# Patient Record
Sex: Female | Born: 1945 | Race: White | Hispanic: No | Marital: Married | Smoking: Former smoker
Health system: Southern US, Community
[De-identification: ages and names within clinical notes are randomized; demographics above are authoritative.]

## PROBLEM LIST (undated history)

## (undated) DIAGNOSIS — R519 Headache, unspecified: Secondary | ICD-10-CM

## (undated) DIAGNOSIS — C801 Malignant (primary) neoplasm, unspecified: Secondary | ICD-10-CM

## (undated) DIAGNOSIS — R51 Headache: Secondary | ICD-10-CM

## (undated) DIAGNOSIS — I10 Essential (primary) hypertension: Secondary | ICD-10-CM

## (undated) DIAGNOSIS — C50911 Malignant neoplasm of unspecified site of right female breast: Principal | ICD-10-CM

## (undated) DIAGNOSIS — E785 Hyperlipidemia, unspecified: Secondary | ICD-10-CM

## (undated) DIAGNOSIS — R05 Cough: Secondary | ICD-10-CM

## (undated) DIAGNOSIS — R059 Cough, unspecified: Secondary | ICD-10-CM

## (undated) DIAGNOSIS — R0981 Nasal congestion: Secondary | ICD-10-CM

## (undated) DIAGNOSIS — R062 Wheezing: Secondary | ICD-10-CM

## (undated) HISTORY — DX: Nasal congestion: R09.81

## (undated) HISTORY — DX: Headache, unspecified: R51.9

## (undated) HISTORY — DX: Malignant (primary) neoplasm, unspecified: C80.1

## (undated) HISTORY — DX: Wheezing: R06.2

## (undated) HISTORY — DX: Malignant neoplasm of unspecified site of right female breast: C50.911

## (undated) HISTORY — PX: OTHER SURGICAL HISTORY: SHX169

## (undated) HISTORY — DX: Headache: R51

## (undated) HISTORY — DX: Hyperlipidemia, unspecified: E78.5

## (undated) HISTORY — DX: Essential (primary) hypertension: I10

## (undated) HISTORY — DX: Cough, unspecified: R05.9

## (undated) HISTORY — DX: Cough: R05

---

## 1966-05-01 HISTORY — PX: BREAST LUMPECTOMY: SHX2

## 1976-05-01 HISTORY — PX: ABDOMINAL HYSTERECTOMY: SHX81

## 2007-05-02 HISTORY — PX: BREAST LUMPECTOMY: SHX2

## 2007-05-20 ENCOUNTER — Encounter: Admission: RE | Admit: 2007-05-20 | Discharge: 2007-05-20 | Payer: Self-pay | Admitting: Family Medicine

## 2007-05-20 ENCOUNTER — Encounter (INDEPENDENT_AMBULATORY_CARE_PROVIDER_SITE_OTHER): Payer: Self-pay | Admitting: Diagnostic Radiology

## 2007-05-29 ENCOUNTER — Encounter: Admission: RE | Admit: 2007-05-29 | Discharge: 2007-05-29 | Payer: Self-pay | Admitting: Family Medicine

## 2007-05-30 ENCOUNTER — Encounter: Admission: RE | Admit: 2007-05-30 | Discharge: 2007-05-30 | Payer: Self-pay | Admitting: Surgery

## 2007-06-10 ENCOUNTER — Encounter: Admission: RE | Admit: 2007-06-10 | Discharge: 2007-06-10 | Payer: Self-pay | Admitting: Surgery

## 2007-06-10 ENCOUNTER — Encounter (INDEPENDENT_AMBULATORY_CARE_PROVIDER_SITE_OTHER): Payer: Self-pay | Admitting: Diagnostic Radiology

## 2007-07-04 ENCOUNTER — Encounter: Admission: RE | Admit: 2007-07-04 | Discharge: 2007-07-04 | Payer: Self-pay | Admitting: Surgery

## 2007-07-05 ENCOUNTER — Encounter: Admission: RE | Admit: 2007-07-05 | Discharge: 2007-07-05 | Payer: Self-pay | Admitting: Surgery

## 2007-07-05 ENCOUNTER — Encounter (INDEPENDENT_AMBULATORY_CARE_PROVIDER_SITE_OTHER): Payer: Self-pay | Admitting: Surgery

## 2007-07-05 ENCOUNTER — Ambulatory Visit (HOSPITAL_BASED_OUTPATIENT_CLINIC_OR_DEPARTMENT_OTHER): Admission: RE | Admit: 2007-07-05 | Discharge: 2007-07-05 | Payer: Self-pay | Admitting: Surgery

## 2007-07-12 ENCOUNTER — Ambulatory Visit: Payer: Self-pay | Admitting: Oncology

## 2007-08-07 LAB — CBC WITH DIFFERENTIAL/PLATELET
Basophils Absolute: 0 10*3/uL (ref 0.0–0.1)
EOS%: 3.9 % (ref 0.0–7.0)
Eosinophils Absolute: 0.2 10*3/uL (ref 0.0–0.5)
HGB: 12.6 g/dL (ref 11.6–15.9)
MCH: 30.7 pg (ref 26.0–34.0)
MCV: 89.2 fL (ref 81.0–101.0)
MONO%: 11.7 % (ref 0.0–13.0)
NEUT#: 3.4 10*3/uL (ref 1.5–6.5)
RBC: 4.09 10*6/uL (ref 3.70–5.32)
RDW: 13.3 % (ref 11.3–14.5)
lymph#: 1.9 10*3/uL (ref 0.9–3.3)

## 2007-08-07 LAB — COMPREHENSIVE METABOLIC PANEL
AST: 20 U/L (ref 0–37)
Albumin: 4.6 g/dL (ref 3.5–5.2)
Alkaline Phosphatase: 91 U/L (ref 39–117)
Calcium: 9.5 mg/dL (ref 8.4–10.5)
Chloride: 107 mEq/L (ref 96–112)
Potassium: 4.2 mEq/L (ref 3.5–5.3)
Sodium: 144 mEq/L (ref 135–145)
Total Protein: 7.5 g/dL (ref 6.0–8.3)

## 2007-08-13 ENCOUNTER — Ambulatory Visit (HOSPITAL_COMMUNITY): Admission: RE | Admit: 2007-08-13 | Discharge: 2007-08-13 | Payer: Self-pay | Admitting: Oncology

## 2007-08-13 ENCOUNTER — Encounter: Payer: Self-pay | Admitting: Oncology

## 2007-08-14 ENCOUNTER — Ambulatory Visit: Admission: RE | Admit: 2007-08-14 | Discharge: 2007-11-11 | Payer: Self-pay | Admitting: Radiation Oncology

## 2007-08-21 LAB — CBC WITH DIFFERENTIAL/PLATELET
BASO%: 1.2 % (ref 0.0–2.0)
LYMPH%: 34.7 % (ref 14.0–48.0)
MCHC: 33.8 g/dL (ref 32.0–36.0)
MONO#: 0.4 10*3/uL (ref 0.1–0.9)
RBC: 3.89 10*6/uL (ref 3.70–5.32)
RDW: 11.9 % (ref 11.3–14.5)
WBC: 5.3 10*3/uL (ref 3.9–10.0)
lymph#: 1.9 10*3/uL (ref 0.9–3.3)

## 2007-08-28 ENCOUNTER — Ambulatory Visit: Payer: Self-pay | Admitting: Oncology

## 2007-08-28 LAB — CBC WITH DIFFERENTIAL/PLATELET
BASO%: 1.6 % (ref 0.0–2.0)
HCT: 34.2 % — ABNORMAL LOW (ref 34.8–46.6)
MCHC: 33.9 g/dL (ref 32.0–36.0)
MONO#: 0.4 10*3/uL (ref 0.1–0.9)
NEUT%: 37.6 % — ABNORMAL LOW (ref 39.6–76.8)
WBC: 4.4 10*3/uL (ref 3.9–10.0)
lymph#: 2 10*3/uL (ref 0.9–3.3)

## 2007-09-04 LAB — CBC WITH DIFFERENTIAL/PLATELET
BASO%: 1.5 % (ref 0.0–2.0)
LYMPH%: 40.4 % (ref 14.0–48.0)
MCH: 31.2 pg (ref 26.0–34.0)
MCHC: 34.5 g/dL (ref 32.0–36.0)
MCV: 90.4 fL (ref 81.0–101.0)
MONO%: 10.2 % (ref 0.0–13.0)
Platelets: 283 10*3/uL (ref 145–400)
RBC: 3.56 10*6/uL — ABNORMAL LOW (ref 3.70–5.32)

## 2007-10-02 LAB — CBC WITH DIFFERENTIAL/PLATELET
Basophils Absolute: 0.1 10*3/uL (ref 0.0–0.1)
Eosinophils Absolute: 0.2 10*3/uL (ref 0.0–0.5)
HCT: 29.8 % — ABNORMAL LOW (ref 34.8–46.6)
HGB: 10.3 g/dL — ABNORMAL LOW (ref 11.6–15.9)
MCV: 92.2 fL (ref 81.0–101.0)
MONO%: 10.7 % (ref 0.0–13.0)
NEUT#: 1.6 10*3/uL (ref 1.5–6.5)
NEUT%: 40.5 % (ref 39.6–76.8)
RDW: 14.3 % (ref 11.3–14.5)
lymph#: 1.6 10*3/uL (ref 0.9–3.3)

## 2007-10-07 ENCOUNTER — Ambulatory Visit: Payer: Self-pay | Admitting: Oncology

## 2007-10-09 LAB — CBC WITH DIFFERENTIAL/PLATELET
Basophils Absolute: 0.1 10*3/uL (ref 0.0–0.1)
Eosinophils Absolute: 0.1 10*3/uL (ref 0.0–0.5)
HGB: 10.2 g/dL — ABNORMAL LOW (ref 11.6–15.9)
LYMPH%: 43.8 % (ref 14.0–48.0)
MCV: 93.3 fL (ref 81.0–101.0)
MONO%: 10.3 % (ref 0.0–13.0)
NEUT#: 1.6 10*3/uL (ref 1.5–6.5)
Platelets: 263 10*3/uL (ref 145–400)

## 2007-11-12 ENCOUNTER — Encounter: Payer: Self-pay | Admitting: Oncology

## 2007-11-12 ENCOUNTER — Ambulatory Visit: Admission: RE | Admit: 2007-11-12 | Discharge: 2007-11-12 | Payer: Self-pay | Admitting: Oncology

## 2007-11-24 ENCOUNTER — Ambulatory Visit: Payer: Self-pay | Admitting: Oncology

## 2007-12-09 LAB — CBC WITH DIFFERENTIAL/PLATELET
Eosinophils Absolute: 0.2 10*3/uL (ref 0.0–0.5)
LYMPH%: 33.1 % (ref 14.0–48.0)
MCV: 93.7 fL (ref 81.0–101.0)
MONO%: 10.9 % (ref 0.0–13.0)
NEUT#: 2.1 10*3/uL (ref 1.5–6.5)
RBC: 3.73 10*6/uL (ref 3.70–5.32)
RDW: 12.6 % (ref 11.3–14.5)

## 2007-12-09 LAB — COMPREHENSIVE METABOLIC PANEL
AST: 19 U/L (ref 0–37)
BUN: 17 mg/dL (ref 6–23)
Calcium: 9.6 mg/dL (ref 8.4–10.5)
Chloride: 106 mEq/L (ref 96–112)
Creatinine, Ser: 1.11 mg/dL (ref 0.40–1.20)
Total Bilirubin: 0.6 mg/dL (ref 0.3–1.2)

## 2007-12-09 LAB — CANCER ANTIGEN 27.29: CA 27.29: 29 U/mL (ref 0–39)

## 2008-02-07 ENCOUNTER — Ambulatory Visit: Payer: Self-pay | Admitting: Oncology

## 2008-02-11 LAB — CBC WITH DIFFERENTIAL/PLATELET
BASO%: 1.1 % (ref 0.0–2.0)
HCT: 36.2 % (ref 34.8–46.6)
HGB: 12.4 g/dL (ref 11.6–15.9)
LYMPH%: 33.1 % (ref 14.0–48.0)
MCHC: 34.1 g/dL (ref 32.0–36.0)
MCV: 90.1 fL (ref 81.0–101.0)
MONO#: 0.5 10*3/uL (ref 0.1–0.9)
NEUT#: 2.4 10*3/uL (ref 1.5–6.5)
NEUT%: 51.1 % (ref 39.6–76.8)
RBC: 4.02 10*6/uL (ref 3.70–5.32)
RDW: 12 % (ref 11.3–14.5)
WBC: 4.7 10*3/uL (ref 3.9–10.0)
lymph#: 1.6 10*3/uL (ref 0.9–3.3)

## 2008-06-26 ENCOUNTER — Ambulatory Visit: Payer: Self-pay | Admitting: Oncology

## 2008-06-30 LAB — COMPREHENSIVE METABOLIC PANEL
AST: 19 U/L (ref 0–37)
Alkaline Phosphatase: 72 U/L (ref 39–117)
CO2: 20 mEq/L (ref 19–32)
Sodium: 142 mEq/L (ref 135–145)
Total Bilirubin: 0.4 mg/dL (ref 0.3–1.2)
Total Protein: 7.1 g/dL (ref 6.0–8.3)

## 2008-06-30 LAB — CBC WITH DIFFERENTIAL/PLATELET
BASO%: 0.4 % (ref 0.0–2.0)
Eosinophils Absolute: 0.2 10*3/uL (ref 0.0–0.5)
HCT: 35.5 % (ref 34.8–46.6)
LYMPH%: 26.2 % (ref 14.0–49.7)
MCH: 31 pg (ref 25.1–34.0)
MCHC: 33.9 g/dL (ref 31.5–36.0)
MONO%: 9.2 % (ref 0.0–14.0)
Platelets: 203 10*3/uL (ref 145–400)
RDW: 12.9 % (ref 11.2–14.5)

## 2008-10-15 ENCOUNTER — Ambulatory Visit: Payer: Self-pay | Admitting: Oncology

## 2008-10-19 ENCOUNTER — Encounter: Payer: Self-pay | Admitting: Pulmonary Disease

## 2008-10-20 DIAGNOSIS — E785 Hyperlipidemia, unspecified: Secondary | ICD-10-CM

## 2008-10-21 ENCOUNTER — Ambulatory Visit: Payer: Self-pay | Admitting: Pulmonary Disease

## 2008-10-21 DIAGNOSIS — R0602 Shortness of breath: Secondary | ICD-10-CM | POA: Insufficient documentation

## 2008-11-13 ENCOUNTER — Ambulatory Visit: Payer: Self-pay | Admitting: Pulmonary Disease

## 2008-12-22 ENCOUNTER — Ambulatory Visit: Payer: Self-pay | Admitting: Pulmonary Disease

## 2008-12-22 LAB — CONVERTED CEMR LAB
CO2: 29 meq/L (ref 19–32)
Calcium: 9.2 mg/dL (ref 8.4–10.5)
GFR calc non Af Amer: 53.45 mL/min (ref >60)
Glucose, Bld: 93 mg/dL (ref 70–99)

## 2008-12-23 ENCOUNTER — Ambulatory Visit: Payer: Self-pay | Admitting: Internal Medicine

## 2009-07-02 ENCOUNTER — Ambulatory Visit: Payer: Self-pay | Admitting: Oncology

## 2009-07-06 LAB — CBC WITH DIFFERENTIAL/PLATELET
Basophils Absolute: 0 10*3/uL (ref 0.0–0.1)
Eosinophils Absolute: 0.1 10*3/uL (ref 0.0–0.5)
HGB: 13.5 g/dL (ref 11.6–15.9)
LYMPH%: 36.7 % (ref 14.0–49.7)
MCHC: 33.7 g/dL (ref 31.5–36.0)
MONO%: 10.5 % (ref 0.0–14.0)
RBC: 4.28 10*6/uL (ref 3.70–5.45)

## 2009-07-06 LAB — COMPREHENSIVE METABOLIC PANEL
AST: 22 U/L (ref 0–37)
Albumin: 4.4 g/dL (ref 3.5–5.2)
Chloride: 106 mEq/L (ref 96–112)
Total Bilirubin: 0.8 mg/dL (ref 0.3–1.2)
Total Protein: 7.6 g/dL (ref 6.0–8.3)

## 2009-07-06 LAB — CANCER ANTIGEN 27.29: CA 27.29: 31 U/mL (ref 0–39)

## 2009-07-07 ENCOUNTER — Ambulatory Visit (HOSPITAL_COMMUNITY): Admission: RE | Admit: 2009-07-07 | Discharge: 2009-07-07 | Payer: Self-pay | Admitting: Oncology

## 2010-07-07 ENCOUNTER — Other Ambulatory Visit: Payer: Self-pay | Admitting: Oncology

## 2010-07-07 ENCOUNTER — Encounter (HOSPITAL_BASED_OUTPATIENT_CLINIC_OR_DEPARTMENT_OTHER): Payer: BC Managed Care – PPO | Admitting: Oncology

## 2010-07-07 DIAGNOSIS — C50919 Malignant neoplasm of unspecified site of unspecified female breast: Secondary | ICD-10-CM

## 2010-07-07 DIAGNOSIS — Z170 Estrogen receptor positive status [ER+]: Secondary | ICD-10-CM

## 2010-07-07 LAB — COMPREHENSIVE METABOLIC PANEL
AST: 22 U/L (ref 0–37)
Albumin: 4.4 g/dL (ref 3.5–5.2)
Alkaline Phosphatase: 72 U/L (ref 39–117)
CO2: 25 mEq/L (ref 19–32)
Calcium: 9.3 mg/dL (ref 8.4–10.5)
Creatinine, Ser: 1.59 mg/dL — ABNORMAL HIGH (ref 0.40–1.20)
Total Bilirubin: 0.8 mg/dL (ref 0.3–1.2)
Total Protein: 6.8 g/dL (ref 6.0–8.3)

## 2010-07-07 LAB — CBC WITH DIFFERENTIAL/PLATELET
Eosinophils Absolute: 0.2 10*3/uL (ref 0.0–0.5)
HCT: 34.1 % — ABNORMAL LOW (ref 34.8–46.6)
MCHC: 34.3 g/dL (ref 31.5–36.0)
MONO#: 0.6 10*3/uL (ref 0.1–0.9)
NEUT#: 2.6 10*3/uL (ref 1.5–6.5)
RBC: 3.75 10*6/uL (ref 3.70–5.45)
RDW: 13.3 % (ref 11.2–14.5)
lymph#: 2 10*3/uL (ref 0.9–3.3)

## 2010-09-01 ENCOUNTER — Encounter (INDEPENDENT_AMBULATORY_CARE_PROVIDER_SITE_OTHER): Payer: Self-pay | Admitting: Surgery

## 2010-09-13 NOTE — Op Note (Signed)
NAME:  Carolyn Strong, Carolyn Strong                ACCOUNT NO.:  [id number]   MEDICAL RECORD NO.:  [id number]          PATIENT TYPE:  AMB   LOCATION:  DSC                          FACILITY:  [facility]   PHYSICIAN:  Thomas A. Cornett, M.D.DATE OF BIRTH:  02-19-1946   DATE OF PROCEDURE:  07/05/2007  DATE OF DISCHARGE:                               OPERATIVE REPORT   PREOPERATIVE DIAGNOSIS:  Right breast cancer x2.   POSTOPERATIVE DIAGNOSIS:  Right breast cancer x2.   PROCEDURES:  1. Right breast needle-localized lumpectomy using two localizing      wires.  2. Right axillary sentinel lymph node mapping with methylene blue dye.   SURGEON:  Maisie Fus A. Cornett, M.D.   ANESTHESIA:  LMA with 0.25% Sensorcaine local.   ESTIMATED BLOOD LOSS:  Approximately 30 mL.   SPECIMENS:  1. Right breast tissue with localizing wire and skin x2 masses to      pathology.  2. Additional margins of lumpectomy cavity.  3. Two right axillary sentinel lymph nodes negative by touch prep.   DRAINS:  None.   INDICATIONS FOR PROCEDURE:  The patient is a 65 year old female who  presented with right breast cancer.  A second foci was identified on  further workup and she had to foci within 2 cm of each other.  She  wished to undergo breast conserving measures if possible and I felt we  could make an attempt to that, with the understanding that if we fail,  mastectomy would be warranted.  She voiced understanding and wished to  proceed.   DESCRIPTION OF PROCEDURE:  After undergoing a wire localization of the  right breast in radiology and injection of technetium sulfur colloid by  the radiology tech, she was brought back to the operating room.  After  induction of LMA anesthesia, 4 mL of methylene blue dye were injected in  a subareolar position under sterile conditions.  Her right breast and  right axilla were prepped and draped in sterile fashion.  The sentinel  node was done first.  The NeoProbe was used, a hot spot  identified in  the right axilla.  Small incision was made.  I was able to identify two  hot blue sentinel nodes which were negative by touch prep.  Background  counts approached zero of the axilla of the supraclavicular region and  internal mammary region.  I placed a moist gauze here.  Next, a  lumpectomy was done.  I infiltrated the skin with 0.25% Sensorcaine.  The wires came out in the right medial inner quadrant.  I took an  ellipse of skin with the incision in a transverse fashion in the medial  breast.  We excised all the tissue around the wires down well below the  nipple.  Radiographs revealed the specimen to be adequate.  I took  additional margins though since some of the margins felt close.  We then  irrigated this out.  We had a very nice lumpectomy with adequate tissue.  We then closed this wound was some 3-0 Vicryl.  The 4-0 Monocryl was  used to close the  skin.  Dermabond was applied to  the skin.  Next, I  irrigated out the axilla, found it to be hemostatic and closed it in  layers  with 3-0 Vicryl, 4-0 Monocryl as a subcuticular stitch and then  Dermabond.  All final counts of sponge, needle and instruments were  found be correct at this portion of the case.  The patient was awoke  taken to recovery in satisfactory condition.      Thomas A. Cornett, M.D.  Electronically Signed     TAC/MEDQ  D:  07/05/2007  T:  07/07/2007  Job:  [id number]   cc:   Dwaine Gale.

## 2011-01-23 LAB — CBC
HCT: 35.7 — ABNORMAL LOW
Hemoglobin: 12.1
MCV: 92.1
RBC: 3.87
WBC: 5.3

## 2011-01-23 LAB — BASIC METABOLIC PANEL
BUN: 16
Chloride: 107
GFR calc Af Amer: >60
Potassium: 4.1

## 2011-01-23 LAB — DIFFERENTIAL
Eosinophils Absolute: 0.2
Eosinophils Relative: 4
Lymphs Abs: 1.9
Monocytes Absolute: 0.5
Monocytes Relative: 9

## 2011-03-17 ENCOUNTER — Encounter (INDEPENDENT_AMBULATORY_CARE_PROVIDER_SITE_OTHER): Payer: Self-pay | Admitting: Surgery

## 2011-03-17 ENCOUNTER — Ambulatory Visit (INDEPENDENT_AMBULATORY_CARE_PROVIDER_SITE_OTHER): Payer: BC Managed Care – PPO | Admitting: Surgery

## 2011-03-17 VITALS — BP 132/84 | HR 68 | Temp 97.6°F | Resp 16 | Ht 65.0 in | Wt 162.4 lb

## 2011-03-17 DIAGNOSIS — Z853 Personal history of malignant neoplasm of breast: Secondary | ICD-10-CM

## 2011-03-17 NOTE — Patient Instructions (Signed)
Follow up in year

## 2011-03-17 NOTE — Progress Notes (Signed)
Subjective:     Patient ID: Carolyn Strong, female   DOB: Nov 20, 1945, 65 y.o.   MRN: [id number]  HPI The patient presents today for followup due to history of breast cancer. She underwent a lumpectomy with sentinel lymph mapping in 2009 for a T1 N0 MX right breast cancer. This was ER and PR positive. She has some right breast soreness. She has no breast mass noted.   Review of Systems  Constitutional: Negative for fever, chills and unexpected weight change.  HENT: Negative for hearing loss, congestion, sore throat, trouble swallowing and voice change.   Eyes: Negative for visual disturbance.  Respiratory: Negative for cough and wheezing.   Cardiovascular: Negative for chest pain, palpitations and leg swelling.  Gastrointestinal: Negative for nausea, vomiting, abdominal pain, diarrhea, constipation, blood in stool, abdominal distention and anal bleeding.  Genitourinary: Negative for hematuria, vaginal bleeding and difficulty urinating.  Musculoskeletal: Negative for arthralgias.  Skin: Negative for rash and wound.  Neurological: Negative for seizures, syncope and headaches.  Hematological: Negative for adenopathy. Does not bruise/bleed easily.  Psychiatric/Behavioral: Negative for confusion.       Objective:   Physical Exam  Constitutional: She appears well-developed and well-nourished.  HENT:  Head: Normocephalic and atraumatic.  Neck: Normal range of motion. Neck supple.  Pulmonary/Chest: Effort normal and breath sounds normal.       Mild right breast cosmetic deformity medial right breast. No mass right breast. Right axilla normal. Left breast normal. Left axilla normal.       Assessment:     History of stage I right breast cancer    Plan:     Return to clinic one year.

## 2011-08-08 ENCOUNTER — Other Ambulatory Visit: Payer: Self-pay | Admitting: *Deleted

## 2011-08-08 DIAGNOSIS — E785 Hyperlipidemia, unspecified: Secondary | ICD-10-CM

## 2011-08-08 DIAGNOSIS — Z853 Personal history of malignant neoplasm of breast: Secondary | ICD-10-CM

## 2011-08-09 ENCOUNTER — Telehealth: Payer: Self-pay | Admitting: *Deleted

## 2011-08-09 ENCOUNTER — Ambulatory Visit (HOSPITAL_BASED_OUTPATIENT_CLINIC_OR_DEPARTMENT_OTHER): Payer: BC Managed Care – PPO | Admitting: Physician Assistant

## 2011-08-09 ENCOUNTER — Encounter: Payer: Self-pay | Admitting: Physician Assistant

## 2011-08-09 ENCOUNTER — Other Ambulatory Visit (HOSPITAL_BASED_OUTPATIENT_CLINIC_OR_DEPARTMENT_OTHER): Payer: BC Managed Care – PPO | Admitting: Lab

## 2011-08-09 VITALS — BP 163/94 | HR 58 | Temp 97.7°F | Ht 65.0 in | Wt 165.0 lb

## 2011-08-09 DIAGNOSIS — C50919 Malignant neoplasm of unspecified site of unspecified female breast: Secondary | ICD-10-CM

## 2011-08-09 DIAGNOSIS — E785 Hyperlipidemia, unspecified: Secondary | ICD-10-CM

## 2011-08-09 DIAGNOSIS — Z853 Personal history of malignant neoplasm of breast: Secondary | ICD-10-CM

## 2011-08-09 DIAGNOSIS — Z170 Estrogen receptor positive status [ER+]: Secondary | ICD-10-CM

## 2011-08-09 DIAGNOSIS — C50911 Malignant neoplasm of unspecified site of right female breast: Secondary | ICD-10-CM

## 2011-08-09 HISTORY — DX: Malignant neoplasm of unspecified site of right female breast: C50.911

## 2011-08-09 LAB — CBC WITH DIFFERENTIAL/PLATELET
BASO%: 1.1 % (ref 0.0–2.0)
Eosinophils Absolute: 0.2 10*3/uL (ref 0.0–0.5)
HCT: 34.9 % (ref 34.8–46.6)
LYMPH%: 37.6 % (ref 14.0–49.7)
MCHC: 33.8 g/dL (ref 31.5–36.0)
MCV: 90.1 fL (ref 79.5–101.0)
MONO#: 0.6 10*3/uL (ref 0.1–0.9)
MONO%: 13.8 % (ref 0.0–14.0)
NEUT%: 42.9 % (ref 38.4–76.8)
Platelets: 242 10*3/uL (ref 145–400)
RBC: 3.87 10*6/uL (ref 3.70–5.45)
nRBC: 0 % (ref 0–0)

## 2011-08-09 LAB — COMPREHENSIVE METABOLIC PANEL
ALT: 19 U/L (ref 0–35)
BUN: 28 mg/dL — ABNORMAL HIGH (ref 6–23)
CO2: 25 mEq/L (ref 19–32)
Calcium: 9 mg/dL (ref 8.4–10.5)
Chloride: 107 mEq/L (ref 96–112)
Creatinine, Ser: 1.29 mg/dL — ABNORMAL HIGH (ref 0.50–1.10)
Total Bilirubin: 0.8 mg/dL (ref 0.3–1.2)

## 2011-08-09 NOTE — Telephone Encounter (Signed)
gave patient appointment for 07-2012 printed out calendar and gave to the patient 

## 2011-08-09 NOTE — Progress Notes (Signed)
ID: Carolyn Strong   DOB: 1946/03/18  MR#: [id number]  WUJ#:[id number]  HISTORY OF PRESENT ILLNESS: The patient had a screening mammogram at [facility] which showed a suspicious mass in the right breast and [facility] on May 20, 2007.  After review of the mammogram from January 2nd and 12th from [facility], the patient had an ultrasound-guided core biopsy of the right breast mass and this showed ([id number] and [id number]) an invasive ductal carcinoma, grade 2, measuring a maximum of 5 mm on the glass slide, ER positive at 50%, PR positive at 40% with an MIB-1 of 15%, Hercept test equivocal but FISH positive at a ratio of 2.72.    With this information, the patient was referred to Dr. Luisa Hart and on May 29, 2007, had bilateral breast MRIs.  This showed the area of original biopsy but in addition, 2 cm anterior to that area, there was a 7 mm enhancing nodule.  The rest of the right breast and the left breast were unremarkable.    The second area of interest was looked for by ultrasound on the same day, January 29th, but could not be located, and therefore, the patient had an MRI-guided core biopsy of the 2nd breast mass June 10, 2007.  This showed ([id number] and [id number]) an invasive ductal carcinoma, Grade 2, 100% ER positive, 100% PR positive, with an elevated MIB-1 at 27%, HER-2 negative at 1+.  With this information and after appropriate discussion with Dr. Luisa Hart, the patient underwent right lumpectomy and sentinel lymph node biopsy July 05, 2007.  The final pathology ([id number]) showed that the area of the first mass (at 4 o'clock) showed no residual tumor.  The second area measured 7 mm.  There was no evidence of lymphovascular invasion.  Margins were negative and ample.  Zero of 2 sentinel lymph nodes were involved.    The patient received adjuvant chemotherapy consisting of 9 doses of paclitaxel with trastuzumab, completed in June of  2009. This was followed by radiation therapy. She then took tamoxifen for 6 months which was discontinued due to vaginal discharge issues. She declined switching to an aromatase inhibitor, and has been followed off treatment since.  INTERVAL HISTORY: Carolyn Strong returns today for routine one-year followup of her right breast carcinoma. Interval history is unremarkable, and Carolyn Strong is staying busy, volunteering for [employer], gardening, and taking care of her 2 grandchildren (ages 15 and 35).   REVIEW OF SYSTEMS: Carolyn Strong has no new complaints today. 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 She has some tightness and aching in her shoulders which she attributes to stress. She denies any additional back pain. She has no headaches, dizziness, change in vision. She's had no recent illnesses and denies fevers, chills, or night sweats. No nausea or change in bowel habits. No chest pain or shortness of breath.  A detailed review of systems is otherwise noncontributory.  PAST MEDICAL HISTORY: Past Medical History  Diagnosis Date  . Cancer     right breast  . Generalized headaches   . Wheezing   . Cough   . Nasal congestion   . Breast cancer, right breast 08/09/2011    PAST SURGICAL HISTORY: Past Surgical History  Procedure Date  . Cyst removed     LEFT BREAST  . Breast lumpectomy 2009    RIGHT BREAST  . Breast lumpectomy 1968    left breast  . Abdominal hysterectomy 1978  FAMILY HISTORY Family History  Problem Relation Age of Onset  . Cancer Father   . Dementia Father   . Cancer Maternal Aunt     breast  . Cancer Paternal Aunt     breast    GYN HISTORY:  The patient is GX, P1.  Her first pregnancy to term was at 70, but her other pregnancy was a premature at 5 months and he survived and is doing fine.  History of simple hysterectomy in 1978 without salpingo-oophorectomy. The patient never took hormone replacement.  She has rare hot flashes, which are not a major  issue for her.  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. Trey Paula is an Radio broadcast assistant.  Their two children are Rocky Link, who works for the [employer] in [facility], and is married with 2 children; and Tammy Sours, who is a Nurse, adult in [place].  The patient has two grandchildren.  The patient is a Control and instrumentation engineer.      ADVANCED DIRECTIVES:  HEALTH MAINTENANCE: History  Substance Use Topics  . Smoking status: Former Smoker    Quit date: 09/17/2007  . Smokeless tobacco: Never Used  . Alcohol Use: No     Colonoscopy:  PAP:  Bone density:  Lipid panel:  Allergies  Allergen Reactions  . Aspirin     REACTION: hives  . Penicillins     REACTION: hives  . Sulfonamide Derivatives     REACTION: hives    Current Outpatient Prescriptions  Medication Sig Dispense Refill  . calcium carbonate 200 MG capsule Take 250 mg by mouth 2 (two) times daily with a meal.      . fluvastatin XL (LESCOL XL) 80 MG 24 hr tablet Take 80 mg by mouth daily.          OBJECTIVE: Filed Vitals:   08/09/11 0909  BP: 163/94  Pulse: 58  Temp: 97.7 F (36.5 C)     Body mass index is 27.46 kg/(m^2).    ECOG FS: 0  Physical Exam: HEENT:  Sclerae anicteric, conjunctivae pink.  Oropharynx clear.  No mucositis or candidiasis.   Nodes:  No cervical, supraclavicular, or axillary lymphadenopathy palpated.  Breast Exam:  Right breast is status post lumpectomy, or tenderness to palpation. No suspicious nodularities or skin changes and no evidence of local recurrence. Left breast is benign with no masses, skin changes, or nipple inversion.    Lungs:  Clear to auscultation bilaterally.  No crackles, rhonchi, or wheezes.   Heart:  Regular rate and rhythm.  , murmurs, or rubs.  Abdomen:  Soft, nontender.  Positive bowel sounds.  No organomegaly or masses palpated.   Musculoskeletal:  No focal spinal tenderness to palpation.  Extremities:  Benign.  No peripheral edema or  cyanosis.   Skin:  Benign.   Neuro:  Nonfocal. alert and oriented x3.    LAB RESULTS: Lab Results  Component Value Date   WBC 4.2 08/09/2011   NEUTROABS 1.8 08/09/2011   HGB 11.8 08/09/2011   HCT 34.9 08/09/2011   MCV 90.1 08/09/2011   PLT 242 08/09/2011      Chemistry      Component Value Date/Time   NA 141 07/07/2010 1323   K 3.8 07/07/2010 1323   CL 104 07/07/2010 1323   CO2 25 07/07/2010 1323   BUN 29* 07/07/2010 1323   CREATININE 1.59* 07/07/2010 1323      Component Value Date/Time   CALCIUM 9.3 07/07/2010 1323   ALKPHOS  72 07/07/2010 1323   AST 22 07/07/2010 1323   ALT 16 07/07/2010 1323   BILITOT 0.8 07/07/2010 1323       Lab Results  Component Value Date   LABCA2 31 07/06/2009    STUDIES: Most recent mammogram was at [facility] in January 2013. We do not have a copy of that report, but the patient will have that faxed to [place]. Per report it was "normal".   ASSESSMENT: A soon-to-be 66 year old [place] woman   (1) status post right lumpectomy and sentinel lymph node biopsy March 2009 for 2 separate masses, the first one measuring 5 mm, triple positive, with an MIB-1 of 15%, the second one measuring 7 mm, ER/PR positive but HER-2/neu negative, with MIB-1 of 27%.  She had 0 of 2 sentinel lymph nodes involved   (2)  Received 9 doses of Taxol with Herceptin, completed in June 2009, followed by radiation.    (3)  She then took tamoxifen for 6 months, discontinued because of vaginal discharge issues.  She decided to forego switching to an aromatase inhibitor and has been followed off treatment since.    PLAN: With regards to her breast cancer, Chelesa continues to do well, and there is no clinical evidence of disease recurrence. She will return for routine followup in one year, and at that time we will likely discharge her from followup. She voices understanding and agreement with our plan and will call with any changes or problems.   Venna Berberich    08/09/2011

## 2011-08-16 ENCOUNTER — Ambulatory Visit: Payer: BC Managed Care – PPO | Admitting: Oncology

## 2011-08-16 ENCOUNTER — Other Ambulatory Visit: Payer: BC Managed Care – PPO | Admitting: Lab

## 2012-03-19 ENCOUNTER — Ambulatory Visit (INDEPENDENT_AMBULATORY_CARE_PROVIDER_SITE_OTHER): Payer: Medicare Other | Admitting: Surgery

## 2012-03-19 ENCOUNTER — Encounter (INDEPENDENT_AMBULATORY_CARE_PROVIDER_SITE_OTHER): Payer: Self-pay | Admitting: Surgery

## 2012-03-19 VITALS — BP 208/92 | HR 72 | Temp 97.5°F | Resp 18 | Ht 65.0 in | Wt 166.0 lb

## 2012-03-19 DIAGNOSIS — Z853 Personal history of malignant neoplasm of breast: Secondary | ICD-10-CM

## 2012-03-19 NOTE — Patient Instructions (Signed)
Return 1 year. 

## 2012-03-19 NOTE — Progress Notes (Signed)
Subjective:     Patient ID: Carolyn Strong, female   DOB: [birth year], 66 y.o.   MRN: [id number]  HPI The patient presents today for followup due to history of breast cancer. She underwent a lumpectomy with sentinel lymph mapping in 2009 for a T1 N0 MX right breast cancer. This was ER and PR positive. She has some right breast soreness. She has no breast mass noted.   Review of Systems  Constitutional: Negative for fever, chills and unexpected weight change.  HENT: Negative for hearing loss, congestion, sore throat, trouble swallowing and voice change.   Eyes: Negative for visual disturbance.  Respiratory: Negative for cough and wheezing.   Cardiovascular: Negative for chest pain, palpitations and leg swelling.  Gastrointestinal: Negative for nausea, vomiting, abdominal pain, diarrhea, constipation, blood in stool, abdominal distention and anal bleeding.  Genitourinary: Negative for hematuria, vaginal bleeding and difficulty urinating.  Musculoskeletal: Negative for arthralgias.  Skin: Negative for rash and wound.  Neurological: Negative for seizures, syncope and headaches.  Hematological: Negative for adenopathy. Does not bruise/bleed easily.  Psychiatric/Behavioral: Negative for confusion.       Objective:   Physical Exam  Constitutional: She appears well-developed and well-nourished.  HENT:  Head: Normocephalic and atraumatic.  Neck: Normal range of motion. Neck supple.  Pulmonary/Chest: Effort normal and breath sounds normal.       Mild right breast cosmetic deformity medial right breast. No mass right breast. Right axilla normal. Left breast normal. Left axilla normal.  Mammogram in [facility] Jan 2013 no change stable     Assessment:     History of stage I right breast cancer    Plan:     Return to clinic one year.

## 2012-08-01 ENCOUNTER — Other Ambulatory Visit (HOSPITAL_BASED_OUTPATIENT_CLINIC_OR_DEPARTMENT_OTHER): Payer: Medicare Other | Admitting: Lab

## 2012-08-01 DIAGNOSIS — C50911 Malignant neoplasm of unspecified site of right female breast: Secondary | ICD-10-CM

## 2012-08-01 DIAGNOSIS — C50919 Malignant neoplasm of unspecified site of unspecified female breast: Secondary | ICD-10-CM

## 2012-08-01 LAB — CBC WITH DIFFERENTIAL/PLATELET
BASO%: 1.2 % (ref 0.0–2.0)
EOS%: 3.2 % (ref 0.0–7.0)
Eosinophils Absolute: 0.2 10*3/uL (ref 0.0–0.5)
LYMPH%: 36.9 % (ref 14.0–49.7)
MCH: 30.4 pg (ref 25.1–34.0)
MCHC: 33.8 g/dL (ref 31.5–36.0)
MCV: 89.9 fL (ref 79.5–101.0)
MONO%: 14.1 % — ABNORMAL HIGH (ref 0.0–14.0)
Platelets: 235 10*3/uL (ref 145–400)
RBC: 4 10*6/uL (ref 3.70–5.45)
RDW: 13.1 % (ref 11.2–14.5)

## 2012-08-01 LAB — COMPREHENSIVE METABOLIC PANEL (CC13)
AST: 23 U/L (ref 5–34)
Alkaline Phosphatase: 101 U/L (ref 40–150)
Glucose: 94 mg/dl (ref 70–99)
Sodium: 140 mEq/L (ref 136–145)
Total Bilirubin: 0.93 mg/dL (ref 0.20–1.20)
Total Protein: 7.1 g/dL (ref 6.4–8.3)

## 2012-08-08 ENCOUNTER — Ambulatory Visit (HOSPITAL_BASED_OUTPATIENT_CLINIC_OR_DEPARTMENT_OTHER): Payer: Medicare Other | Admitting: Oncology

## 2012-08-08 VITALS — BP 173/75 | HR 57 | Temp 97.9°F | Resp 20 | Ht 65.0 in | Wt 169.4 lb

## 2012-08-08 DIAGNOSIS — C50911 Malignant neoplasm of unspecified site of right female breast: Secondary | ICD-10-CM

## 2012-08-08 DIAGNOSIS — Z853 Personal history of malignant neoplasm of breast: Secondary | ICD-10-CM

## 2012-08-08 NOTE — Progress Notes (Signed)
ID: Carolyn Strong   DOB: 12/29/1945  MR#: [id number]  WGN#:[id number]  PCP: Mary Sella, NP   HISTORY OF PRESENT ILLNESS: The patient had a screening mammogram at [facility] which showed a suspicious mass in the right breast and was referred to the [facility] for further evaluation on May 20, 2007.  After review of the mammogram from January 2nd and 12th from [facility], the patient had an ultrasound-guided core biopsy of the right breast mass and this showed ([id number] and [id number]) an invasive ductal carcinoma, grade 2, measuring a maximum of 5 mm on the glass slide, ER positive at 50%, PR positive at 40% with an MIB-1 of 15%, Hercept test equivocal but FISH positive at a ratio of 2.72.    With this information, the patient was referred to Dr. Luisa Hart and on May 29, 2007, had bilateral breast MRIs.  This showed the area of original biopsy but in addition, 2 cm anterior to that area, there was a 7 mm enhancing nodule.  The rest of the right breast and the left breast were unremarkable.    The second area of interest was looked for by ultrasound on the same day, January 29th, but could not be located, and therefore, the patient had an MRI-guided core biopsy of the 2nd breast mass June 10, 2007.  This showed ([id number] and [id number]) an invasive ductal carcinoma, Grade 2, 100% ER positive, 100% PR positive, with an elevated MIB-1 at 27%, HER-2 negative at 1+.  With this information and after appropriate discussion with Dr. Luisa Hart, the patient underwent right lumpectomy and sentinel lymph node biopsy July 05, 2007.  The final pathology ([id number]) showed that the area of the first mass (at 4 o'clock) showed no residual tumor.  The second area measured 7 mm.  There was no evidence of lymphovascular invasion.  Margins were negative and ample.  Zero of 2 sentinel lymph nodes were involved.    The patient received adjuvant chemotherapy consisting of 9 doses of paclitaxel with  trastuzumab, completed in June of 2009. This was followed by radiation therapy. She then took tamoxifen for 6 months which was discontinued due to vaginal discharge issues. She declined switching to an aromatase inhibitor, and has been followed off treatment since.  INTERVAL HISTORY: Carolyn Strong returns today for followup of her breast cancer. She is enjoying going to her grandchildren's football games. She is happy to be "graduating" from breast cancer followup today.  REVIEW OF SYSTEMS: She still has some soreness in the right breast, but this is neither more frequent normal or intense than prior. She's been diagnosed with macular degeneration. She tells me that she gets short of breath when she walks up particular he upstairs her up a slope, but sometimes also on a flat surface. We went over her last echo here, which was obtained after she completed her chemotherapy, and that was excellent. I gave her a copy of that. She also had a CT angiogram and 2010. She denies chest pain or pressure, cough, phlegm production, or pleurisy. A detailed review of systems today was otherwise unremarkable  PAST MEDICAL HISTORY: Past Medical History  Diagnosis Date  . Cancer     right breast  . Generalized headaches   . Wheezing   . Cough   . Nasal congestion   . Breast cancer, right breast 08/09/2011  . Hyperlipidemia   . Hypertension     PAST SURGICAL HISTORY: Past Surgical History  Procedure Laterality Date  . Cyst removed  LEFT BREAST  . Breast lumpectomy  2009    RIGHT BREAST  . Breast lumpectomy  1968    left breast  . Abdominal hysterectomy  1978    FAMILY HISTORY Family History  Problem Relation Age of Onset  . Cancer Father   . Dementia Father   . Cancer Maternal Aunt     breast  . Cancer Paternal Aunt     breast    GYN HISTORY:  The patient is GX, P1.  Her first pregnancy to term was at 65, but her other pregnancy was a premature at 5 months and he survived and is doing fine.   History of simple hysterectomy in 1978 without salpingo-oophorectomy. The patient never took hormone replacement.  She has rare hot flashes, which are not a major issue for her.  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. Trey Paula is an Radio broadcast assistant.  Their two children are Rocky Link, who works for the [employer] in [facility], and is married with 2 children; and Tammy Sours, who is a Nurse, adult in [place].  The patient has two grandchildren.  The patient is a Control and instrumentation engineer.      ADVANCED DIRECTIVES: in place  HEALTH MAINTENANCE: History  Substance Use Topics  . Smoking status: Former Smoker    Quit date: 09/17/2007  . Smokeless tobacco: Never Used  . Alcohol Use: No     Colonoscopy:  PAP:  Bone density:  Lipid panel:  Allergies  Allergen Reactions  . Aspirin     REACTION: hives  . Penicillins     REACTION: hives  . Sulfonamide Derivatives     REACTION: hives    Current Outpatient Prescriptions  Medication Sig Dispense Refill  . calcium carbonate 200 MG capsule Take 250 mg by mouth 2 (two) times daily with a meal.      . fluvastatin XL (LESCOL XL) 80 MG 24 hr tablet Take 80 mg by mouth daily.        Marland Kitchen losartan (COZAAR) 50 MG tablet Take 50 mg by mouth daily.      . multivitamin-lutein (OCUVITE-LUTEIN) CAPS Take 1 capsule by mouth daily.      . pravastatin (PRAVACHOL) 40 MG tablet Take 40 mg by mouth daily.       No current facility-administered medications for this visit.    OBJECTIVE: Middle-aged white Strong who appears well Filed Vitals:   08/08/12 0940  BP: 173/75  Pulse: 57  Temp: 97.9 F (36.6 C)  Resp: 20     Body mass index is 28.19 kg/(m^2).    ECOG FS: 0  Sclerae unicteric Oropharynx clear No cervical or supraclavicular adenopathy Lungs no rales or rhonchi Heart regular rate and rhythm Abd benign MSK no focal spinal tenderness, no peripheral edema Neuro: nonfocal, well oriented, positive affect Breasts:  The right breast is status post lumpectomy and radiation. There is some tenderness to palpation, but no suspicious masses, and no evidence of local recurrence. The right axilla is benign. The left breast is unremarkable   LAB RESULTS: Lab Results  Component Value Date   WBC 5.0 08/01/2012   NEUTROABS 2.2 08/01/2012   HGB 12.2 08/01/2012   HCT 36.0 08/01/2012   MCV 89.9 08/01/2012   PLT 235 08/01/2012      Chemistry      Component Value Date/Time   NA 140 08/01/2012 1003   NA 141 08/09/2011 0848   K 3.9 08/01/2012 1003   K 3.9 08/09/2011  0848   CL 107 08/01/2012 1003   CL 107 08/09/2011 0848   CO2 25 08/01/2012 1003   CO2 25 08/09/2011 0848   BUN 21.5 08/01/2012 1003   BUN 28* 08/09/2011 0848   CREATININE 1.5* 08/01/2012 1003   CREATININE 1.29* 08/09/2011 0848      Component Value Date/Time   CALCIUM 9.3 08/01/2012 1003   CALCIUM 9.0 08/09/2011 0848   ALKPHOS 101 08/01/2012 1003   ALKPHOS 80 08/09/2011 0848   AST 23 08/01/2012 1003   AST 23 08/09/2011 0848   ALT 18 08/01/2012 1003   ALT 19 08/09/2011 0848   BILITOT 0.93 08/01/2012 1003   BILITOT 0.8 08/09/2011 0848       Lab Results  Component Value Date   LABCA2 31 07/06/2009    STUDIES: Clinical Data: Shortness of breath. Fatigue. History breast  cancer.  CT ANGIOGRAPHY CHEST WITH CONTRAST  Technique: Multidetector CT imaging of the chest was performed  using the standard protocol during bolus administration of  intravenous contrast. Multiplanar CT image reconstructions  including MIPs were obtained to evaluate the vascular anatomy.  Contrast: 80 ml Omnipaque-300  Comparison: 10/21/2008  Findings: No filling defect is identified in the pulmonary  arterial tree to suggest pulmonary embolus.  A fluid density along the margins of the ascending thoracic aorta  appears to represent fluid collected in the superior pericardial  recesses.  Small paratracheal lymph nodes do not appear pathologically  enlarged by size criteria. All of the larger upper  paratracheal  nodes has a short axis diameter of 0.6 cm on image 12 of series #4.  A right lower hilar node has a short axis diameter of 7 mm on image  51 of series #4.  A subcarinal lymph node has a short axis diameter of 1.2 cm on  image 38 of series #4.  Moderate cardiomegaly is present to involving both the right heart  and the left heart.  Biapical pleuroparenchymal scarring is present. A 2 mm nodule  adjacent to the right major fissure on image 28 of series #6 is  statistically likely to be benign but technically nonspecific due  to small size.  Particularly in the lower lobes, there is a slight mosaic  attenuation which could be from a small airways or small vessel  disease. 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 There is  also some mild airway plugging noted in the right middle lobe, for  example on images 64-69 of series #6.  Review of the MIP images confirms the above findings.  IMPRESSION:  1. No embolus.  2. Small mediastinal lymph nodes are not pathologically enlarged.  3. Moderate cardiomegaly.  4. Slight mosaic attenuation in the lower lobes, with an  appearance favoring obstructive bronchiolitis.  5. 2 mm nodule along the right major fissure, likely incidental.  6. Biapical pleuroparenchymal scarring.  Provider: Josetta Huddle    Transthoracic Echocardiogram Patient: Carolyn Strong MR Number: Study Date: 12-Nov-2007  --------------------------------------------------------------- Gender: Age: DOB: Height: Weight: BSA: Pt. Status: Room:   STAFF PERFORMING Jaclyn Prime. Lucas Mallow M.D. SONOGRAPHER Nolon Rod ORDERING Cicero Duck Sidda Humm MD  ---------------------------------------------------------------  INDICATIONS: Chemotherapy 963.1 Assess left ventricular ejection fraction.  HISTORY: Patient has no history of  cardiovascular disease. Chemotherapy.  ---------------------------------------------------------------  PROCEDURE INFORMATION: A transthoracic complete 2D study was performed. Additional evaluation included M-mode, complete spectral Doppler, and color Doppler. This  was a routine echocardiographic study. The study was performed in the echo lab. The study was performed in the [facility]. The patient reported no pain pre or post test.  ---------------------------------------------------------------  2D Measurements LEFT VENTRICLE NORMAL LVID ed (chordal) 47 mm 36-56 mm LVID es (chordal) 31 mm -- FS (chordal) 34 % 28-44% IVS ed 9 mm 6-11 mm LVPW ed 12 mm 6-11 mm AORTA NORMAL AoD (root) 29 mm <26mm LEFT ATRIUM NORMAL LAD 33 mm 19-38mm LAD index (A-P) 1.8 cm/m^2 <2.2 cm/m^2  Doppler measurements LEFT VENTRICLE NORMAL Tissue Doppler LV Ea (lat annulus) 6.8 cm/sec -- LV E/Ea (lat annulus) 12 -- LV Ea (med annulus) 6.1 cm/sec -- LV E/Ea (med annulus) 13.4 -- MITRAL VALVE NORMAL Peak E velocity 81.9 cm/sec -- Peak A velocity 85.4 cm/sec -- MV peak E/A 1 -- MV deceleration time 216 msec 150-230 msec Peak gradient 3 mmHg --  ---------------------------------------------------------------  LEFT VENTRICLE: - Left ventricular size was normal. - Overall left ventricular systolic function was normal. - Left ventricular ejection fraction was estimated , range being 65 % to 75 %. - There was no diagnostic evidence of left ventricular regional wall motion abnormalities. - Left ventricular wall thickness was at the upper limits of normal.  Doppler interpretation(s): - Left ventricular diastolic function parameters were normal.  AORTIC VALVE: - The aortic valve was grossly normal. - The aortic valve was not well visualized.  Doppler interpretation(s): - There was no significant aortic valvular regurgitation.  AORTA: - The aortic root was normal in size.  MITRAL  VALVE: - The mitral valve was grossly normal.  Doppler interpretation(s): - There was no significant mitral valvular regurgitation.  LEFT ATRIUM: - Left atrial size was normal.  PULMONARY VEINS: Doppler interpretation(s): - The Doppler flow pattern was normal.  RIGHT VENTRICLE: - Right ventricular size was normal. - Right ventricular systolic function was normal.  PULMONIC VALVE: - The pulmonic valve was not well visualized.  TRICUSPID VALVE: - The tricuspid valve was grossly normal.  Doppler interpretation(s): - There was no significant tricuspid valvular regurgitation.  PULMONARY ARTERY: - Pulmonary artery size could not be determined.  RIGHT ATRIUM: - Right atrial size was normal.  SYSTEMIC VEINS: - The inferior vena cava was normal.  PERICARDIUM: - There was no pericardial effusion.  ---------------------------------------------------------------  SUMMARY - Overall left ventricular systolic function was normal. Left ventricular ejection fraction was estimated , range being 65 % to 75 %. There was no diagnostic evidence of left ventricular regional wall motion abnormalities. Left ventricular wall thickness was at the upper limits of normal. Left ventricular diastolic function parameters were normal.  ---------------------------------------------------------------     Most recent mammogram was at [facility] in 06/26/2012. It was benign   ASSESSMENT: 67 y.o.  Carolyn Strong   (1) status post right lumpectomy and sentinel lymph node biopsy March 2009 for 2 separate masses, the first one measuring 5 mm, triple positive, with an MIB-1 of 15%, the second one measuring 7 mm, ER/PR positive but HER-2/neu negative, with MIB-1 of 27%.  She had 0 of 2 sentinel lymph nodes involved   (2)  Received 9 doses of Taxol with Herceptin, completed in June 2009, followed by radiation.    (3)  She then took tamoxifen for 6 months, discontinued because of vaginal  discharge issues.  She decided to forego switching to an aromatase inhibitor and has been followed off treatment since.    PLAN: Carolyn Strong has completed 5 years of followup, and I  am comfortable releasing her to her primary care physician at this point. She understands that we keep a record for the next 10 years, and anytime in that interval if she has a question or concern or if we need to see her, we will of course be glad to. As of now however we are making no further routine followup appointment for her here.  Oracio Galen C    08/08/2012

## 2013-01-28 DIAGNOSIS — I10 Essential (primary) hypertension: Secondary | ICD-10-CM | POA: Insufficient documentation

## 2013-06-30 DIAGNOSIS — I2510 Atherosclerotic heart disease of native coronary artery without angina pectoris: Secondary | ICD-10-CM | POA: Insufficient documentation

## 2014-06-17 DIAGNOSIS — Z2821 Immunization not carried out because of patient refusal: Secondary | ICD-10-CM | POA: Insufficient documentation

## 2015-07-08 DIAGNOSIS — M8580 Other specified disorders of bone density and structure, unspecified site: Secondary | ICD-10-CM | POA: Insufficient documentation

## 2015-07-08 DIAGNOSIS — Z853 Personal history of malignant neoplasm of breast: Secondary | ICD-10-CM | POA: Insufficient documentation

## 2015-08-02 ENCOUNTER — Ambulatory Visit (INDEPENDENT_AMBULATORY_CARE_PROVIDER_SITE_OTHER): Payer: Medicare Other

## 2015-08-02 ENCOUNTER — Ambulatory Visit (INDEPENDENT_AMBULATORY_CARE_PROVIDER_SITE_OTHER): Payer: Medicare Other | Admitting: Podiatry

## 2015-08-02 ENCOUNTER — Encounter: Payer: Self-pay | Admitting: Podiatry

## 2015-08-02 VITALS — BP 152/76 | HR 68 | Resp 12

## 2015-08-02 DIAGNOSIS — M722 Plantar fascial fibromatosis: Secondary | ICD-10-CM | POA: Diagnosis not present

## 2015-08-02 MED ORDER — METHYLPREDNISOLONE 4 MG PO TBPK: ORAL_TABLET | ORAL | Status: DC

## 2015-08-02 NOTE — Progress Notes (Signed)
   Subjective:    Patient ID: Carolyn Strong, female    DOB: February 09, 1946, 70 y.o.   MRN: [id number]  HPI: Left bottom of the foot and hurting for about a month. She states that is painful in the mornings when she gets up to walk. She is unable to take nonsteroidal secondary to gastric problems.    Review of Systems  Musculoskeletal: Positive for gait problem.  All other systems reviewed and are negative.      Objective:   Physical Exam: Vital signs are stable alert and oriented 3. Pulses are strongly palpable. Neurologic sensorium is intact. Deep tendon reflexes are intact. Muscle strength is 5 over 5 dorsiflexion plantar flexors and inverters everters all intrinsic musculature is intact. Orthopedic evaluation demonstrates all joints distal to the ankle range of motion without crepitation. Cutaneous evaluation demonstrates soft supple well-hydrated cutis no open lesions early open wounds. She has pain on palpation medial calcaneal tubercle of her left heel. Radiographs do demonstrate all plantar distally oriented calcaneal heel spur with a soft tissue increase in density at the plantar fascial calcaneal insertion site.        Assessment & Plan:  Assessment: Chronic intractable plantar fasciitis left foot.  Plan: Start her on a Medrol Dosepak today injected her left heel placed her in a plantar fascial brace and followed by a night splint. Discussed appropriate shoe gear stretching exercises ice therapy and shoe modifications. I will follow up with her in 1 month.

## 2015-08-02 NOTE — Patient Instructions (Signed)

## 2015-08-30 ENCOUNTER — Encounter: Payer: Self-pay | Admitting: Podiatry

## 2015-08-30 ENCOUNTER — Ambulatory Visit (INDEPENDENT_AMBULATORY_CARE_PROVIDER_SITE_OTHER): Payer: Medicare Other | Admitting: Podiatry

## 2015-08-30 VITALS — BP 136/70 | HR 61 | Resp 16

## 2015-08-30 DIAGNOSIS — M722 Plantar fascial fibromatosis: Secondary | ICD-10-CM

## 2015-08-30 NOTE — Progress Notes (Signed)
She presents today for follow-up of her plantar fasciitis left heel. She states this is definitely much better. But states that it may still be a little bit swollen on the inside.  Objective: I'll signs are stable alert and oriented 3. Pulses are palpable. She is pain on palpation medial tubercle of the left heel.  Assessment: Pain in limb secondary plantar fasciitis left.  Plan: Injected left heel once again today continue all conservative therapies including braces and night splint and oral medication. Follow-up with me in 1 month.

## 2015-10-04 ENCOUNTER — Ambulatory Visit (INDEPENDENT_AMBULATORY_CARE_PROVIDER_SITE_OTHER): Payer: Medicare Other | Admitting: Podiatry

## 2015-10-04 ENCOUNTER — Encounter: Payer: Self-pay | Admitting: Podiatry

## 2015-10-04 VITALS — BP 137/72 | HR 73 | Resp 12

## 2015-10-04 DIAGNOSIS — M722 Plantar fascial fibromatosis: Secondary | ICD-10-CM

## 2015-10-04 NOTE — Progress Notes (Signed)
She presents today states that her left heel medial aspect is doing better however the lateral aspect is starting to hurt now.  Objective: Vital signs are stable alert and oriented 3 she has pain on palpation medial and lateral aspects of the left calcaneus.  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  Plan: I injected the left heel today we will the lateral aspect with Kenalog and local anesthetic to help alleviate her symptoms. She will continue the use of her other modalities to help alleviate her symptoms. Myofascial brace night splint etc.

## 2015-11-22 ENCOUNTER — Ambulatory Visit (INDEPENDENT_AMBULATORY_CARE_PROVIDER_SITE_OTHER): Payer: Medicare Other | Admitting: Podiatry

## 2015-11-22 ENCOUNTER — Ambulatory Visit (INDEPENDENT_AMBULATORY_CARE_PROVIDER_SITE_OTHER): Payer: Medicare Other

## 2015-11-22 ENCOUNTER — Encounter: Payer: Self-pay | Admitting: Podiatry

## 2015-11-22 VITALS — BP 147/88 | HR 69 | Resp 12

## 2015-11-22 DIAGNOSIS — M779 Enthesopathy, unspecified: Secondary | ICD-10-CM | POA: Diagnosis not present

## 2015-11-22 DIAGNOSIS — M8430XA Stress fracture, unspecified site, initial encounter for fracture: Secondary | ICD-10-CM

## 2015-11-22 DIAGNOSIS — M722 Plantar fascial fibromatosis: Secondary | ICD-10-CM | POA: Diagnosis not present

## 2015-11-22 NOTE — Progress Notes (Signed)
She presents today for follow-up of her plantar fasciitis and states this seems to be doing better however it is particularly painful today on the top outside area.  Objective: Vital signs are stable alert and oriented 3 pulses are palpable left foot. Positive pain on palpation medial calcaneal tubercle of the left heel. Positive pain on palpation of the fourth metatarsal neck of the left foot. Previous fractures have been noted in the second and third metatarsals on radiographs taken today. Appears to be a stress fracture of the fourth metatarsal neck on radiograph.  Assessment: Pain left plantar fasciitis. Fracture fourth metatarsal left.  Plan: Placed her in a Cam Walker for her fasciitis as well as for the fracture. I also injected the area to the left heel. She'll present back in a few weeks at which time another x-ray will be performed.

## 2015-12-06 ENCOUNTER — Ambulatory Visit: Payer: Medicare Other | Admitting: Podiatry

## 2015-12-20 ENCOUNTER — Ambulatory Visit (INDEPENDENT_AMBULATORY_CARE_PROVIDER_SITE_OTHER): Payer: Medicare Other | Admitting: Podiatry

## 2015-12-20 ENCOUNTER — Encounter: Payer: Self-pay | Admitting: Podiatry

## 2015-12-20 DIAGNOSIS — M8430XA Stress fracture, unspecified site, initial encounter for fracture: Secondary | ICD-10-CM

## 2015-12-20 DIAGNOSIS — M722 Plantar fascial fibromatosis: Secondary | ICD-10-CM | POA: Diagnosis not present

## 2015-12-21 NOTE — Progress Notes (Signed)
She presents today for follow-up of stress fracture left foot. Plantar fasciitis. She has only staying sensations now occasionally but feels much better otherwise.  Objective: Vital signs are stable she is alert and oriented 3 no erythema or edema saline as drainage or odor radiographs demonstrate no fractures. She is tenderness on palpation making tubercle but much less degree. She also has some tenderness on palpation of the forefoot along the third and fourth metatarsophalangeal joint area.  Assessment: Resolving fasciitis capsulitis and stress fracture.  Plan: Follow up with me on an as-needed basis.

## 2016-03-20 ENCOUNTER — Encounter: Payer: Self-pay | Admitting: Podiatry

## 2016-03-20 ENCOUNTER — Ambulatory Visit (INDEPENDENT_AMBULATORY_CARE_PROVIDER_SITE_OTHER): Payer: Medicare Other | Admitting: Podiatry

## 2016-03-20 DIAGNOSIS — M722 Plantar fascial fibromatosis: Secondary | ICD-10-CM | POA: Diagnosis not present

## 2016-03-20 DIAGNOSIS — M8430XA Stress fracture, unspecified site, initial encounter for fracture: Secondary | ICD-10-CM | POA: Diagnosis not present

## 2016-03-20 NOTE — Progress Notes (Signed)
She presents today for follow-up of her left heel. States this is hurting in the heel just swell some days and around the ankle and the leg.  Objective: Vital signs are stable she is alert and oriented 3. She still has pain on palpation of the second metatarsal with a stress fracture left foot. She still has pain on palpation may continue to move the left heel.  Assessment: Stress fracture second metatarsal left foot fasciitis left.  Plan: Reinjected the left heel again today. Encouraged her to wear her Cam Walker when her forefoot becomes painful. I will follow-up with her in a few weeks. Did discuss the need for orthotics.

## 2016-04-17 ENCOUNTER — Ambulatory Visit (INDEPENDENT_AMBULATORY_CARE_PROVIDER_SITE_OTHER): Payer: Medicare Other | Admitting: Podiatry

## 2016-04-17 ENCOUNTER — Ambulatory Visit (INDEPENDENT_AMBULATORY_CARE_PROVIDER_SITE_OTHER): Payer: Medicare Other

## 2016-04-17 DIAGNOSIS — M84375A Stress fracture, left foot, initial encounter for fracture: Secondary | ICD-10-CM | POA: Diagnosis not present

## 2016-04-17 DIAGNOSIS — M722 Plantar fascial fibromatosis: Secondary | ICD-10-CM

## 2016-04-17 NOTE — Progress Notes (Signed)
She presents today for follow-up of her plantar fasciitis to her left foot and the lateral aspect of her foot was painful. She states that the plantar fasciitis seems to be doing better. The fracture to the second metatarsal appears to be doing better. She states that she has not been wearing her boot lately.  Objective: Vital signs are stable she is alert and oriented 3. Pulses are palpable. She is tenderness on palpation of the lateral aspect his foot around the fourth and fifth metatarsal cuboid articulation less pain to the second and third metatarsals and less pain even to the plantar fascia calcaneal insertion site left. Radiographs taken today 3 views left foot does not demonstrate any type of osseus abnormalities and these areas of question well-healing fracture sites of second third metastases.  Assessment: Resolving plantar fasciitis stress fracture. 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  Plan: I injected the area today with considerable difficulty for the patient. Kenalog and local anesthetic was administered I will follow up with her in 1 month she will notify me with questions or concerns.

## 2016-05-15 ENCOUNTER — Ambulatory Visit: Payer: Medicare Other | Admitting: Podiatry

## 2016-11-20 ENCOUNTER — Ambulatory Visit: Payer: Medicare Other | Admitting: Podiatry

## 2016-11-27 ENCOUNTER — Ambulatory Visit (INDEPENDENT_AMBULATORY_CARE_PROVIDER_SITE_OTHER): Payer: Medicare Other | Admitting: Podiatry

## 2016-11-27 ENCOUNTER — Encounter: Payer: Self-pay | Admitting: Podiatry

## 2016-11-27 DIAGNOSIS — G5782 Other specified mononeuropathies of left lower limb: Secondary | ICD-10-CM

## 2016-11-27 DIAGNOSIS — G5762 Lesion of plantar nerve, left lower limb: Secondary | ICD-10-CM

## 2016-11-27 DIAGNOSIS — M722 Plantar fascial fibromatosis: Secondary | ICD-10-CM | POA: Diagnosis not present

## 2016-11-27 NOTE — Progress Notes (Signed)
She presents today for follow-up of capsulitis and fasciitis left foot. She states that been doing K for a while doing very well but then started suddenly having sharp pains in the bottom of the foot and cramping across the top.  Objective: Vital signs are stable she is alert and oriented 3. Pulses are palpable. She has pain on palpation mucogingival the left heel with a palpable Mulder's click to the third interdigital space of the left foot. Radiographs taken today demonstrate soft tissue increase in density plantar fascia insertion site indicative of plantar fasciitis. She also has pain on palpation third interdigital space of the left foot with a palpable Mulder's click.  Assessment: Neuroma and plantar fasciitis left.  Plan: Injected dexamethasone to the third interdigital space injected Kenalog to the lateral aspect of the left heel.

## 2016-12-25 ENCOUNTER — Ambulatory Visit: Payer: Self-pay | Admitting: Podiatry

## 2019-02-20 DIAGNOSIS — J302 Other seasonal allergic rhinitis: Secondary | ICD-10-CM | POA: Insufficient documentation

## 2019-02-20 DIAGNOSIS — R053 Chronic cough: Secondary | ICD-10-CM | POA: Insufficient documentation

## 2019-02-20 DIAGNOSIS — Q321 Other congenital malformations of trachea: Secondary | ICD-10-CM | POA: Insufficient documentation

## 2019-02-20 DIAGNOSIS — R918 Other nonspecific abnormal finding of lung field: Secondary | ICD-10-CM | POA: Insufficient documentation

## 2019-02-20 DIAGNOSIS — R062 Wheezing: Secondary | ICD-10-CM | POA: Insufficient documentation

## 2019-02-20 DIAGNOSIS — J439 Emphysema, unspecified: Secondary | ICD-10-CM | POA: Insufficient documentation

## 2019-04-05 DIAGNOSIS — B342 Coronavirus infection, unspecified: Secondary | ICD-10-CM | POA: Insufficient documentation

## 2019-10-08 ENCOUNTER — Ambulatory Visit: Payer: Medicare HMO | Admitting: Podiatry

## 2019-10-22 ENCOUNTER — Ambulatory Visit: Payer: Medicare HMO | Admitting: Podiatry

## 2019-10-22 ENCOUNTER — Other Ambulatory Visit: Payer: Self-pay

## 2019-10-22 ENCOUNTER — Ambulatory Visit (INDEPENDENT_AMBULATORY_CARE_PROVIDER_SITE_OTHER): Payer: Medicare HMO

## 2019-10-22 ENCOUNTER — Encounter: Payer: Self-pay | Admitting: Podiatry

## 2019-10-22 DIAGNOSIS — M722 Plantar fascial fibromatosis: Secondary | ICD-10-CM

## 2019-10-22 MED ORDER — METHYLPREDNISOLONE 4 MG PO TBPK
ORAL_TABLET | ORAL | 0 refills | Status: DC
Start: 2019-10-22 — End: 2020-01-07

## 2019-10-22 MED ORDER — MELOXICAM 15 MG PO TABS
15.0000 mg | ORAL_TABLET | Freq: Every day | ORAL | 3 refills | Status: DC
Start: 2019-10-22 — End: 2020-02-19

## 2019-10-22 NOTE — Progress Notes (Signed)
Subjective:  Patient ID: Carolyn Strong, female    DOB: [birth year],  MRN: [id number] HPI Chief Complaint  Patient presents with  . Foot Pain    Patient presents today for right heel pain x 1 month.  She states "it feels like walking on a bruise and pains shoot up my foot"   She says its really painful in the mornings and standing from sitting.  She saw urgent care and Podiatrist in [facility] 3 weeks ago and was given a Cortisone injection which did not help    74 y.o. female presents with the above complaint.   ROS: Denies fever chills nausea vomiting muscle aches pains calf pain back pain chest pain shortness of breath.  Past Medical History:  Diagnosis Date  . Breast cancer, right breast ([place]) 08/09/2011  . Cancer ([place])    right breast  . Cough   . Generalized headaches   . Hyperlipidemia   . Hypertension   . Nasal congestion   . Wheezing    Past Surgical History:  Procedure Laterality Date  . ABDOMINAL HYSTERECTOMY  1978  . BREAST LUMPECTOMY  2009   RIGHT BREAST  . BREAST LUMPECTOMY  1968   left breast  . CYST REMOVED     LEFT BREAST    Current Outpatient Medications:  .  amLODipine (NORVASC) 5 MG tablet, Take 5 mg by mouth., Disp: , Rfl:  .  fluticasone (FLONASE) 50 MCG/ACT nasal spray, Place into both nostrils daily., Disp: , Rfl:  .  GENTLE LAXATIVE 5 MG EC tablet, TK 4 TS PO ALL AT ONCE, Disp: , Rfl: 0 .  loratadine (CLARITIN) 10 MG tablet, Take 10 mg by mouth., Disp: , Rfl:  .  lovastatin (MEVACOR) 40 MG tablet, , Disp: , Rfl: 3 .  meloxicam (MOBIC) 15 MG tablet, Take 1 tablet (15 mg total) by mouth daily., Disp: 30 tablet, Rfl: 3 .  methylPREDNISolone (MEDROL DOSEPAK) 4 MG TBPK tablet, 6 day dose pack - take as directed, Disp: 21 tablet, Rfl: 0 .  polyethylene glycol-electrolytes (NULYTELY/GOLYTELY) 420 g solution, MIX AND DRINK UTD, Disp: , Rfl: 0  Allergies  Allergen Reactions  . Albuterol Other (See Comments)    rigors   . Penicillins     REACTION:  hives  . Aspirin Itching and Rash    REACTION: hives  . Clindamycin Nausea And Vomiting    Took medication after stopping medication was nauseated x 3 weeks  . Sulfa Antibiotics Itching  . Triprolidine-Pseudoephedrine Itching and Rash   Review of Systems Objective:  There were no vitals filed for this visit.  General: Well developed, nourished, in no acute distress, alert and oriented x3   Dermatological: Skin is warm, dry and supple bilateral. Nails x 10 are well maintained; remaining integument appears unremarkable at this time. There are no open sores, no preulcerative lesions, no rash or signs of infection present.  Vascular: Dorsalis Pedis artery and Posterior Tibial artery pedal pulses are 2/4 bilateral with immedate capillary fill time. Pedal hair growth present. No varicosities and no lower extremity edema present bilateral.   Neruologic: Grossly intact via light touch bilateral. Vibratory intact via tuning fork bilateral. Protective threshold with Semmes Wienstein monofilament intact to all pedal sites bilateral. Patellar and Achilles deep tendon reflexes 2+ bilateral. No Babinski or clonus noted bilateral.   Musculoskeletal: No gross boney pedal deformities bilateral. No pain, crepitus, or limitation noted with foot and ankle range of motion bilateral. Muscular strength 5/5 in all groups  tested bilateral.  She has pain on palpation medial calcaneal tubercle of the right heel. Gait: Unassisted, Nonantalgic.    Radiographs:  Radiographs taken today demonstrate soft tissue increase in density plantar fascial kidney insertion site of the right heel.  Assessment & Plan:   Assessment: Plan fasciitis right.  Plan: Injected the right heel today 20 mg Kenalog 5 mg Marcaine point of maximal tenderness.  Start her on a Medrol Dosepak to be followed by meloxicam.  Discussed appropriate shoe gear stretching exercise ice therapy shoe gear modifications and also provided her with a plantar  fascial brace.  Follow-up with her in 1 month     [name] [place], [place]

## 2019-12-01 ENCOUNTER — Ambulatory Visit: Payer: Medicare HMO | Admitting: Podiatry

## 2019-12-01 ENCOUNTER — Other Ambulatory Visit: Payer: Self-pay

## 2019-12-01 DIAGNOSIS — S93691A Other sprain of right foot, initial encounter: Secondary | ICD-10-CM

## 2019-12-01 NOTE — Progress Notes (Signed)
She presents today for follow-up of her plantar fasciitis of her right foot.  She states that she was walking the other day and felt a pop with sharp pain in the bottom of her right heel.  Objective: Vital signs are stable she alert oriented x3 severe pain palpation to the insertion site of the plantar fascia just distal to where it typically is.  There feels some laxity of the medial band.  Assessment: Probable tear of the plantar fascia right.  Plan: Placed her in a cam boot today we will follow-up with her in 3 weeks if not improved MRI will be necessary.

## 2020-01-07 ENCOUNTER — Other Ambulatory Visit: Payer: Self-pay

## 2020-01-07 ENCOUNTER — Other Ambulatory Visit: Payer: Self-pay | Admitting: Podiatry

## 2020-01-07 ENCOUNTER — Ambulatory Visit: Payer: Medicare HMO | Admitting: Podiatry

## 2020-01-07 ENCOUNTER — Encounter: Payer: Self-pay | Admitting: Podiatry

## 2020-01-07 DIAGNOSIS — S93691D Other sprain of right foot, subsequent encounter: Secondary | ICD-10-CM | POA: Diagnosis not present

## 2020-01-07 NOTE — Progress Notes (Signed)
She presents today for follow-up plantar fascial right she states that it is better than it was have been wearing the boot most of the time but if I stepped on it wrong or twisted wrong it really hurts.  Objective: Vital signs are stable alert oriented x3 with dorsiflexion of the hallux right her middle band of her medial longitudinal arch and the proximal portion of that band are severely painful.  She does have better range of motion of her toes now that she previously did.  Assessment: Query a plantar fascial rupture right foot.  Plan: At this point I feel that MRI is necessary for surgical evaluation.

## 2020-01-13 ENCOUNTER — Telehealth: Payer: Self-pay | Admitting: Podiatry

## 2020-01-13 NOTE — Telephone Encounter (Signed)
Patient called inquiring about status of MRI, I left her a VM that the MRI Josem Kaufmann is pending and they will contact her at GI to schedule when completed  Lanae Crumbly, DPM 01/13/2020

## 2020-01-22 ENCOUNTER — Telehealth: Payer: Self-pay

## 2020-01-22 NOTE — Telephone Encounter (Signed)
MRI has been approved from 01/22/2020 to 07/21/2020 Auth# [id number]  Patient has been notified of approval and has appt scheduled on 02/12/2020

## 2020-02-12 ENCOUNTER — Ambulatory Visit
Admission: RE | Admit: 2020-02-12 | Discharge: 2020-02-12 | Disposition: A | Discharge status: Home / Self Care | Payer: Medicare HMO | Source: Ambulatory Visit | Attending: Podiatry | Admitting: Podiatry

## 2020-02-12 ENCOUNTER — Other Ambulatory Visit: Payer: Self-pay

## 2020-02-12 DIAGNOSIS — S93691D Other sprain of right foot, subsequent encounter: Secondary | ICD-10-CM

## 2020-02-19 ENCOUNTER — Other Ambulatory Visit: Payer: Self-pay | Admitting: Podiatry

## 2020-03-04 ENCOUNTER — Telehealth: Payer: Self-pay | Admitting: *Deleted

## 2020-03-04 NOTE — Telephone Encounter (Signed)
-----   Message from Rip Harbour, [facility] sent at 02/23/2020  8:16 AM EDT ----- She needs a consult appt/MRI result ----- Message ----- From: Cedric Fishman Sent: 02/23/2020   6:51 AM EDT To: Rip Harbour, PM[facility]  Have her in for consult

## 2020-03-04 NOTE — Telephone Encounter (Signed)
I'm calling you to schedule an appointment with Dr. Milinda Pointer.  He received your MRI results.  "I've already got an appointment on the 10th."  Okay, we'll see you then.

## 2020-03-10 ENCOUNTER — Encounter: Payer: Self-pay | Admitting: Podiatry

## 2020-03-10 ENCOUNTER — Ambulatory Visit: Payer: Medicare HMO | Admitting: Podiatry

## 2020-03-10 ENCOUNTER — Other Ambulatory Visit: Payer: Self-pay

## 2020-03-10 DIAGNOSIS — S93691D Other sprain of right foot, subsequent encounter: Secondary | ICD-10-CM | POA: Diagnosis not present

## 2020-03-10 DIAGNOSIS — S86311D Strain of muscle(s) and tendon(s) of peroneal muscle group at lower leg level, right leg, subsequent encounter: Secondary | ICD-10-CM

## 2020-03-10 NOTE — Progress Notes (Signed)
She presents today for follow-up of her plantar fascia and ankle pain.  States that is not any better and the foot looks more swollen.  Now her whole foot is starting her because of walking on the forefoot.  Objective: Vital signs are stable alert and oriented x3.  Pulses are palpable.  There is mild edema no erythema cellulitis drainage or odor severe pain on palpation medial calcaneal tubercle right.  Pain on palpation of the peroneal tendons right.  She has pain against abduction and resistance.  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  Plan: Gust etiology pathology conservative versus surgical therapies at this point we consented her today for an endoscopic plantar fasciotomy medial band and a repair of the peroneus brevis tendon.  Answered all the questions regarding these procedures to the best of my ability in layman's terms.  Answered all questions regarding these procedures to the best of my ability in layman's terms we did discuss the possible postop complications as well.  We discussed that the may include but are not limited to postop pain bleeding swelling infection recurrence need for further surgery overcorrection under correction loss of digit loss of limb loss of life.  Answered all the questions she understood this was amendable to it dispensed information regarding the surgery center anesthesia group and instructions for the morning of surgery.  Follow-up with her in the near future for surgical intervention.

## 2020-04-06 ENCOUNTER — Telehealth: Payer: Self-pay | Admitting: Podiatry

## 2020-04-06 NOTE — Telephone Encounter (Signed)
DOS: 04/30/2020  Procedures: Endoscopic Plantar Fasciotomy Rt (97588), Peroneal Tendon Repair Rt ([id number]), and Cast Application Rt  [employer] Medicare Effective From 05/01/2016 -  Deductible: $0 Out of Pocket: $4,950 with $996.89 met and $3,953.11 remaining. CoInsurance: 100% Copay: $310  Per Wendelyn Breslow T no Prior Authorization or Referrals are required. Call Reference Number [id number]

## 2020-04-28 ENCOUNTER — Other Ambulatory Visit: Payer: Self-pay | Admitting: Podiatry

## 2020-04-28 MED ORDER — ONDANSETRON HCL 4 MG PO TABS: 4.0000 mg | ORAL_TABLET | Freq: Three times a day (TID) | ORAL | 0 refills | Status: DC | PRN

## 2020-04-28 MED ORDER — OXYCODONE-ACETAMINOPHEN 10-325 MG PO TABS: 1.0000 | ORAL_TABLET | Freq: Three times a day (TID) | ORAL | 0 refills | Status: AC | PRN

## 2020-04-28 MED ORDER — DOXYCYCLINE HYCLATE 100 MG PO TABS: 100.0000 mg | ORAL_TABLET | Freq: Two times a day (BID) | ORAL | 0 refills | Status: DC

## 2020-04-30 DIAGNOSIS — S93691D Other sprain of right foot, subsequent encounter: Secondary | ICD-10-CM

## 2020-04-30 DIAGNOSIS — S86311D Strain of muscle(s) and tendon(s) of peroneal muscle group at lower leg level, right leg, subsequent encounter: Secondary | ICD-10-CM | POA: Diagnosis not present

## 2020-05-05 ENCOUNTER — Other Ambulatory Visit: Payer: Self-pay

## 2020-05-05 ENCOUNTER — Ambulatory Visit (INDEPENDENT_AMBULATORY_CARE_PROVIDER_SITE_OTHER): Payer: Medicare HMO | Admitting: Podiatry

## 2020-05-05 VITALS — BP 143/69 | HR 61

## 2020-05-05 DIAGNOSIS — S86311D Strain of muscle(s) and tendon(s) of peroneal muscle group at lower leg level, right leg, subsequent encounter: Secondary | ICD-10-CM

## 2020-05-05 NOTE — Progress Notes (Signed)
And presents today for follow-up of her peroneal tendon repair she is 1 week status post peroneal tendon repair with cast right.  She states that she is unable to take the oxycodone it is made her nauseous really sick  Objective: Denies fever chills muscle aches pains calf pain back pain chest pain shortness of breath.  Cast is intact toes have good sensation she has a loose cast proximally snug distally.  Does not wear it is not dirty does not show any signs of breakdown.  Assessment: Well-healing surgical foot and leg with cast application.  Plan: Follow-up with her in 1 week for cast removal and reapplication.

## 2020-05-12 ENCOUNTER — Ambulatory Visit (INDEPENDENT_AMBULATORY_CARE_PROVIDER_SITE_OTHER): Payer: Medicare HMO | Admitting: Podiatry

## 2020-05-12 ENCOUNTER — Encounter: Payer: Self-pay | Admitting: Podiatry

## 2020-05-12 ENCOUNTER — Other Ambulatory Visit: Payer: Self-pay

## 2020-05-12 DIAGNOSIS — S93691D Other sprain of right foot, subsequent encounter: Secondary | ICD-10-CM

## 2020-05-12 DIAGNOSIS — S86311D Strain of muscle(s) and tendon(s) of peroneal muscle group at lower leg level, right leg, subsequent encounter: Secondary | ICD-10-CM | POA: Diagnosis not present

## 2020-05-12 DIAGNOSIS — Z9889 Other specified postprocedural states: Secondary | ICD-10-CM

## 2020-05-12 NOTE — Progress Notes (Signed)
She presents today for follow-up of her endoscopic plantar fasciotomy repair of her peroneal tendon.  States that is doing just fine denies fever chills nausea muscle aches pains calf pain back pain chest pain shortness of breath.  Objective: Cast is intact dry and clean.  Once removed demonstrates Trester compressive dressing intact margins well coapted staples are intact sutures are intact minimal edema no erythema cellulitis drainage or odor no signs of infection.  Assessment: Well-healing surgical foot.  Plan: Place her in a dressing compressive dressing and a shoulder below-knee cast I will follow-up with her in 2 weeks for cast removal.

## 2020-05-26 ENCOUNTER — Encounter: Payer: Self-pay | Admitting: Podiatry

## 2020-05-26 ENCOUNTER — Ambulatory Visit (INDEPENDENT_AMBULATORY_CARE_PROVIDER_SITE_OTHER): Payer: Medicare HMO | Admitting: Podiatry

## 2020-05-26 ENCOUNTER — Other Ambulatory Visit: Payer: Self-pay

## 2020-05-26 DIAGNOSIS — S93691D Other sprain of right foot, subsequent encounter: Secondary | ICD-10-CM

## 2020-05-26 DIAGNOSIS — S86311D Strain of muscle(s) and tendon(s) of peroneal muscle group at lower leg level, right leg, subsequent encounter: Secondary | ICD-10-CM

## 2020-05-26 DIAGNOSIS — Z9889 Other specified postprocedural states: Secondary | ICD-10-CM

## 2020-05-26 NOTE — Progress Notes (Signed)
She presents today nearly 1 month status post peroneal tendon repair right.  States that she is feeling pretty good neck is swollen is a little tight feeling but all in all is doing well.  Objective: Vital signs are stable she is alert oriented x3.  Pulses are palpable.  Cast was removed which was dry and clean sutures were removed staples removed there is no erythema no cellulitis drainage or odor there is mild edema.  She is good good dorsiflexion and plantarflexion she got good abduction against resistance.  Assessment: Well-healing surgical foot.  I would allow her to shower as long as she is sitting she can stand in a static position with her foot down however she is not to walk on this foot as of yet, follow-up with her in 2 weeks at which time we will start partial weightbearing.

## 2020-06-09 ENCOUNTER — Ambulatory Visit (INDEPENDENT_AMBULATORY_CARE_PROVIDER_SITE_OTHER): Payer: Medicare HMO | Admitting: Podiatry

## 2020-06-09 ENCOUNTER — Encounter: Payer: Self-pay | Admitting: Podiatry

## 2020-06-09 ENCOUNTER — Other Ambulatory Visit: Payer: Self-pay

## 2020-06-09 DIAGNOSIS — S93691D Other sprain of right foot, subsequent encounter: Secondary | ICD-10-CM

## 2020-06-09 DIAGNOSIS — Z9889 Other specified postprocedural states: Secondary | ICD-10-CM

## 2020-06-09 DIAGNOSIS — S86311D Strain of muscle(s) and tendon(s) of peroneal muscle group at lower leg level, right leg, subsequent encounter: Secondary | ICD-10-CM

## 2020-06-09 NOTE — Progress Notes (Signed)
She presents today date of surgery 04/30/2020 status post EPF peroneal tendon repair and cast.  She states that the foot feels okay she is starting to have knee pain from the scooter.  Objective: Foot is healing very nicely she has good abduction against resistance.  There is no erythema to some mild edema no cellulitis drainage or odor.  Assessment: Well-healing surgical foot.  Plan: At this point I would allow her to start ambulating on a regular basis with her

## 2020-06-23 ENCOUNTER — Encounter: Payer: Self-pay | Admitting: Podiatry

## 2020-06-23 ENCOUNTER — Other Ambulatory Visit: Payer: Self-pay

## 2020-06-23 ENCOUNTER — Ambulatory Visit (INDEPENDENT_AMBULATORY_CARE_PROVIDER_SITE_OTHER): Payer: Medicare HMO | Admitting: Podiatry

## 2020-06-23 DIAGNOSIS — S93691D Other sprain of right foot, subsequent encounter: Secondary | ICD-10-CM

## 2020-06-23 DIAGNOSIS — Z9889 Other specified postprocedural states: Secondary | ICD-10-CM

## 2020-06-23 DIAGNOSIS — S86311D Strain of muscle(s) and tendon(s) of peroneal muscle group at lower leg level, right leg, subsequent encounter: Secondary | ICD-10-CM | POA: Diagnosis not present

## 2020-06-23 NOTE — Progress Notes (Signed)
She presents today for postop visit date of surgery 04/30/2020 status post EPF repair as well as a peroneal tendon repair.  She states that she is doing great she is very happy with the outcome.  States that the foot really does not hurt at all she continues to walk with her cam boot.  Denies fever chills nausea vomiting muscle aches and pains shortness of breath or chest pain.  Objective: Vital signs are stable she is alert oriented x3.  Pulses are palpable.  There is mild edema no erythema cellulitis drainage or odor she has good dorsiflexion plantarflexion inversion and eversion.  Assessment: Well-healing surgical foot.  Plan: At this point I am going to request that she start wearing a tennis shoe with a Tri-Lock brace which we dispensed today and I will follow-up with her in approximately 1 month and hopefully we will be close to being done at that point.

## 2020-07-14 ENCOUNTER — Other Ambulatory Visit: Payer: Self-pay

## 2020-07-14 ENCOUNTER — Encounter: Payer: Self-pay | Admitting: Podiatry

## 2020-07-14 ENCOUNTER — Ambulatory Visit (INDEPENDENT_AMBULATORY_CARE_PROVIDER_SITE_OTHER): Payer: Medicare HMO | Admitting: Podiatry

## 2020-07-14 DIAGNOSIS — S93691D Other sprain of right foot, subsequent encounter: Secondary | ICD-10-CM

## 2020-07-14 DIAGNOSIS — Z9889 Other specified postprocedural states: Secondary | ICD-10-CM

## 2020-07-14 DIAGNOSIS — S86311D Strain of muscle(s) and tendon(s) of peroneal muscle group at lower leg level, right leg, subsequent encounter: Secondary | ICD-10-CM

## 2020-07-14 NOTE — Progress Notes (Signed)
She presents today for follow-up of her peroneal tendon repair as well as her endoscopic plantar fasciotomy states that the foot feels great and has no problems but the brace hurts my leg.  States that the swelling is gone.  Objective: Vital signs are stable alert oriented x3 there is minimal edema no erythema cellulitis drainage or odor no reproducible pain on palpation or range of motion or with muscle testing.  Assessment: Well-healing surgical foot.  Plan: I will follow-up with her in 1 month and allow her to get back to her regular routine she knows no high-impact activities she just can start to increase her ambulation.

## 2020-08-18 ENCOUNTER — Encounter: Payer: Self-pay | Admitting: Podiatry

## 2020-08-18 ENCOUNTER — Other Ambulatory Visit: Payer: Self-pay

## 2020-08-18 ENCOUNTER — Ambulatory Visit: Payer: Medicare HMO | Admitting: Podiatry

## 2020-08-18 DIAGNOSIS — S93691D Other sprain of right foot, subsequent encounter: Secondary | ICD-10-CM

## 2020-08-18 DIAGNOSIS — Z9889 Other specified postprocedural states: Secondary | ICD-10-CM | POA: Diagnosis not present

## 2020-08-18 DIAGNOSIS — S86311D Strain of muscle(s) and tendon(s) of peroneal muscle group at lower leg level, right leg, subsequent encounter: Secondary | ICD-10-CM | POA: Diagnosis not present

## 2020-08-18 NOTE — Progress Notes (Signed)
She presents today for follow-up of her EPF peroneal tendon repair and cast application states that is feeling pretty good and hurts across the top with some spasms occasionally maybe a couple times a week for just a few minutes but otherwise is doing really well.  Objective: Vital signs are stable alert oriented x3 there is no erythema edema cellulitis drainage or odor.  She is doing quite well good abduction against resistance.  Assessment: Well-healing surgical foot.  Plan: Follow-up with me as needed.

## 2021-02-02 ENCOUNTER — Ambulatory Visit: Payer: Medicare HMO | Admitting: Podiatry

## 2021-02-02 ENCOUNTER — Other Ambulatory Visit: Payer: Self-pay

## 2021-02-02 DIAGNOSIS — M778 Other enthesopathies, not elsewhere classified: Secondary | ICD-10-CM

## 2021-02-02 DIAGNOSIS — M722 Plantar fascial fibromatosis: Secondary | ICD-10-CM

## 2021-02-02 MED ORDER — TRIAMCINOLONE ACETONIDE 40 MG/ML IJ SUSP
40.0000 mg | Freq: Once | INTRAMUSCULAR | Status: AC
  Administered 2021-02-02: 40 mg

## 2021-02-02 NOTE — Progress Notes (Signed)
She presents today complaining of left heel pain and right forefoot pain.  Denies any trauma.  Objective: Vital signs are stable alert and oriented x3.  Pulses are palpable.  She has pain on end range of motion particularly plantar flexion of the second and third metatarsophalangeal joints of the right foot pain on palpation medial calcaneal tubercle of the left foot.  Assessment: Plan fasciitis left capsulitis of the second and third metatarsophalangeal joints of the right foot.  Plan: Discussed etiology pathology and surgical therapies at this point I injected her left heel 20 mg Kenalog 5 mg Marcaine point of maximal tenderness.  Injected the second interdigital space around the metatarsophalangeal joints with 10 mg Kenalog 5 mg Marcaine.  Tolerated procedure well without complications discussed appropriate shoe gear stretching exercise ice therapy shoe gear modifications we will follow-up with her on an as-needed basis.

## 2021-04-27 ENCOUNTER — Encounter: Payer: Self-pay | Admitting: Podiatry

## 2021-04-27 ENCOUNTER — Other Ambulatory Visit: Payer: Self-pay

## 2021-04-27 ENCOUNTER — Ambulatory Visit: Payer: Medicare HMO | Admitting: Podiatry

## 2021-04-27 DIAGNOSIS — M7671 Peroneal tendinitis, right leg: Secondary | ICD-10-CM

## 2021-04-27 MED ORDER — DEXAMETHASONE SODIUM PHOSPHATE 120 MG/30ML IJ SOLN
2.0000 mg | Freq: Once | INTRAMUSCULAR | Status: AC
  Administered 2021-04-27: 14:00:00 2 mg via INTRA_ARTICULAR

## 2021-04-27 NOTE — Progress Notes (Signed)
She presents today for a follow-up of pain in her right ankle.  States that she had a shot there before that seemed to help but she would like to consider another 1 she is at is very painful through the holidays we did peroneus brevis surgery there over a year ago and she did well with that and has now started having problems in the peroneus longus.  Objective: Vital signs are stable she is alert and oriented x3 she has tenderness on palpation right up against the inferior most aspect of the lateral malleolus there is fluctuance in this area palpable pulses are strong and palpable no open lesions or wounds.  She has pain on plantarflexion with eversion of the foot but straight abduction of the foot is nontender.  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  Plan: I injected the dexamethasone to this area today 2 mg was injected with local anesthetic after sterile alcohol prep and ethyl chloride spray.  She tolerated it well but stated that it was quite painful.  She will follow-up with me with questions or concerns or if symptoms fail to resolve.

## 2022-06-07 IMAGING — MR MR HEEL *R* W/O CM
5 series · 36 of 40 positions shown · non-contrast
Comparison: Right foot x-rays dated October 22, 2019.

CLINICAL DATA: Right ankle and plantar heel pain since [REDACTED]. No
injury or prior surgery.

EXAM:
MR OF THE RIGHT HEEL WITHOUT CONTRAST
TECHNIQUE: Multiplanar, multisequence MR imaging of the right ankle was
performed. No intravenous contrast was administered.

[Series 4: T2 fat-sat · axial · 3.0mm · 0.50mm/px · z∈[-44,+76]mm · 8 of 32 slices shown (1 of 2)]
[im 1/32]
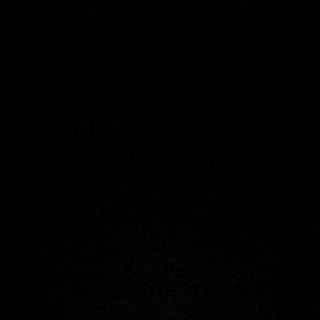
[im 4/32]
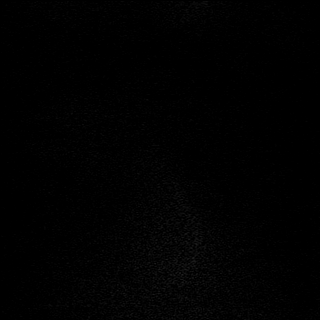
[im 11/32]
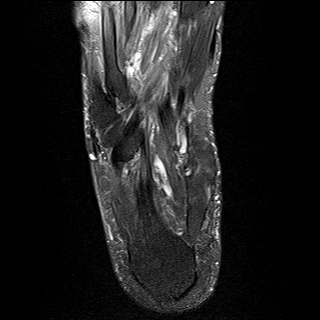
[im 14/32]
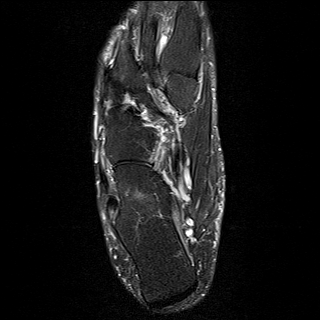
[im 18/32]
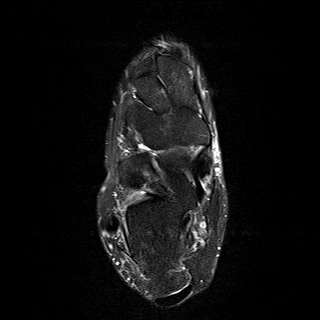
[im 21/32]
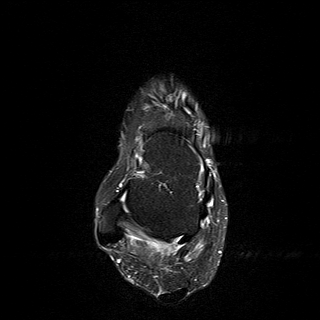
[im 28/32]
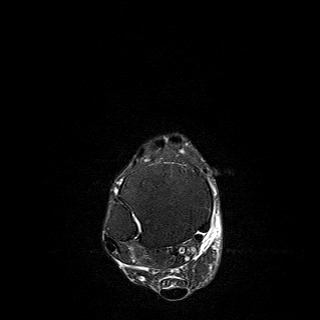
[im 32/32]
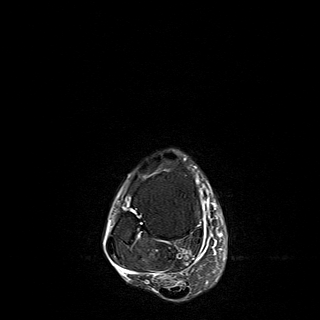

[Series 5: PD fat-sat · axial · 3.0mm · 0.42mm/px · z∈[-44,+76]mm · 10 of 32 slices shown]
[im 1/32]
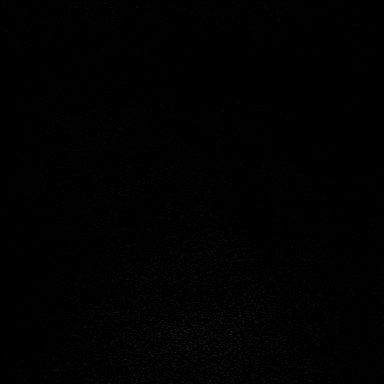
[im 4/32]
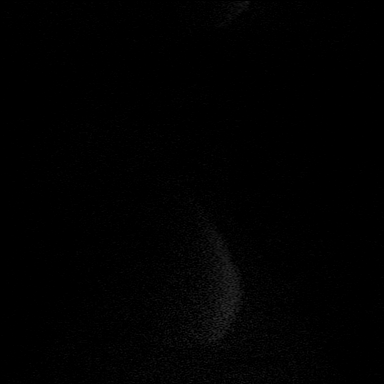
[im 7/32]
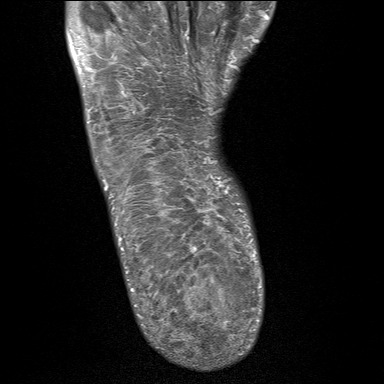
[im 11/32]
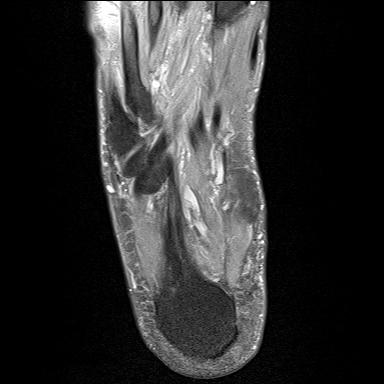
[im 14/32]
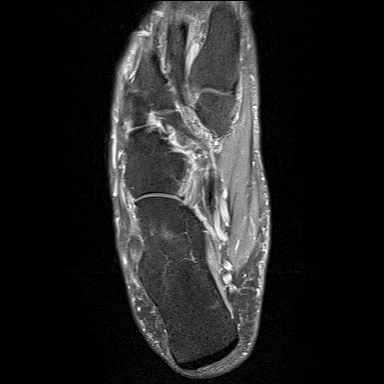
[im 18/32]
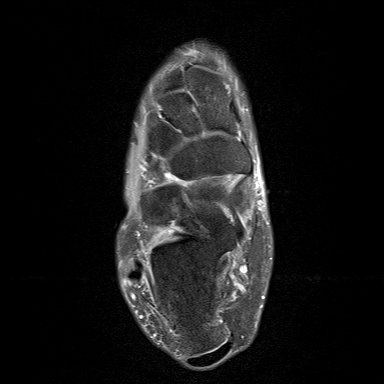
[im 21/32]
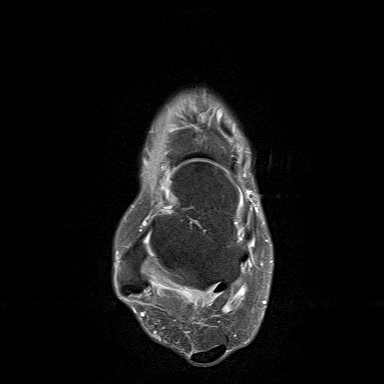
[im 25/32]
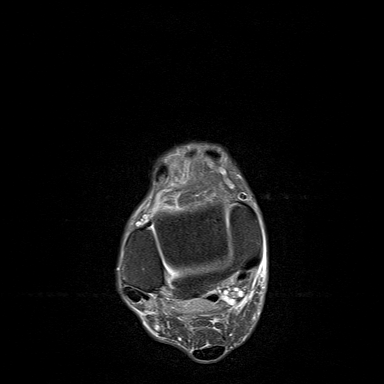
[im 28/32]
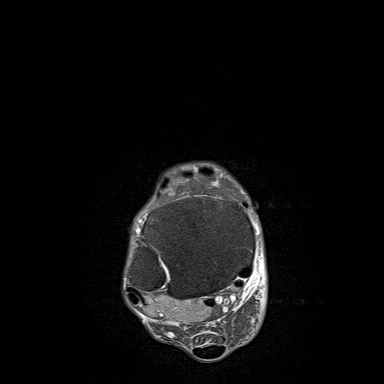
[im 32/32]
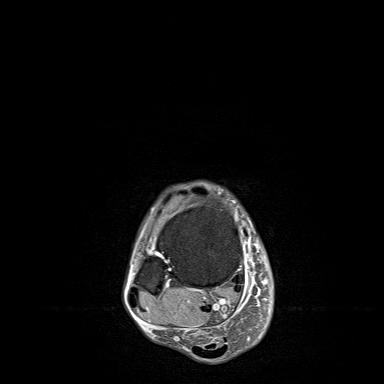

[Series 6: T1 · sagittal · 4.0mm · 0.56mm/px · 5 of 18 slices shown]
[im 1/18]
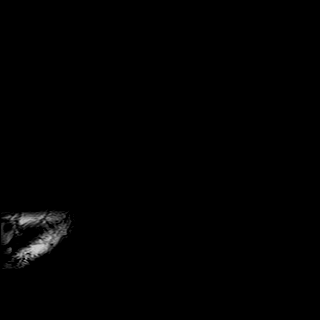
[im 5/18]
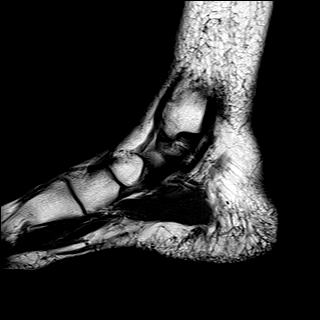
[im 9/18]
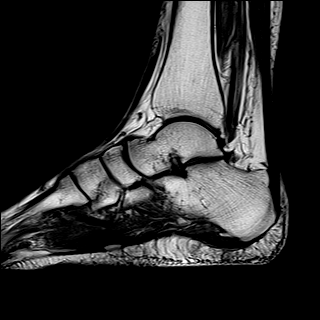
[im 13/18]
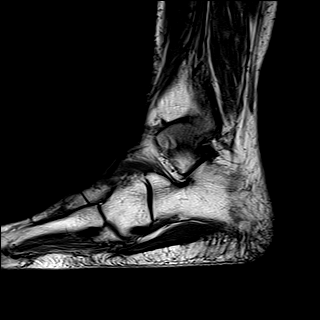
[im 18/18]
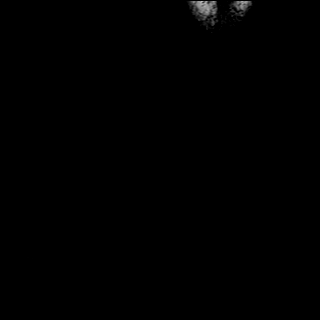

[Series 7: STIR · sagittal · 4.0mm · 0.35mm/px · 5 of 18 slices shown]
[im 1/18]
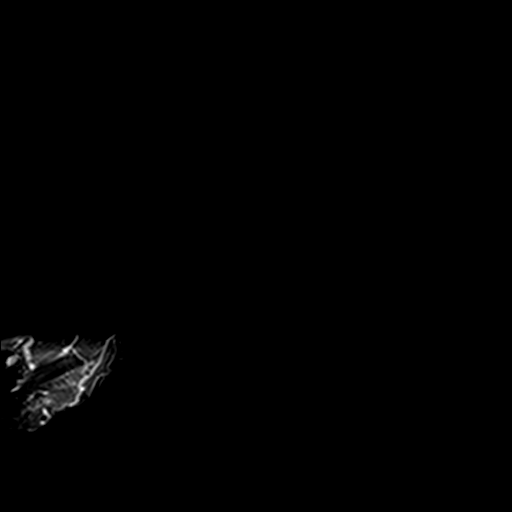
[im 5/18]
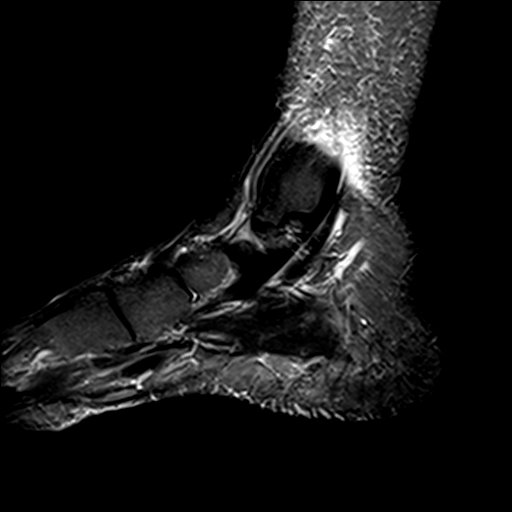
[im 9/18]
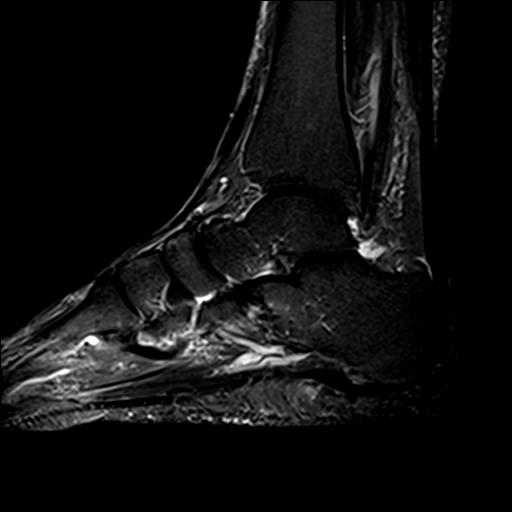
[im 13/18]
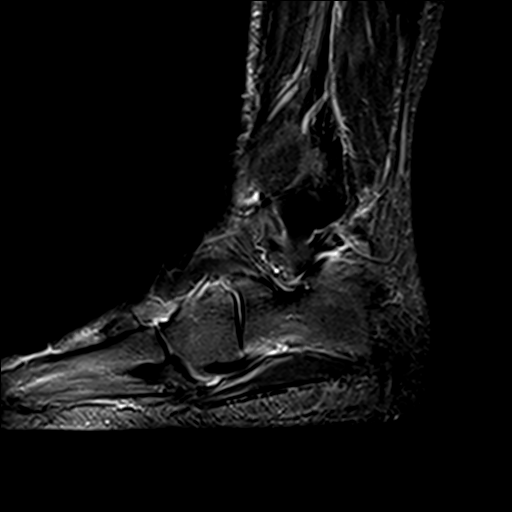
[im 18/18]
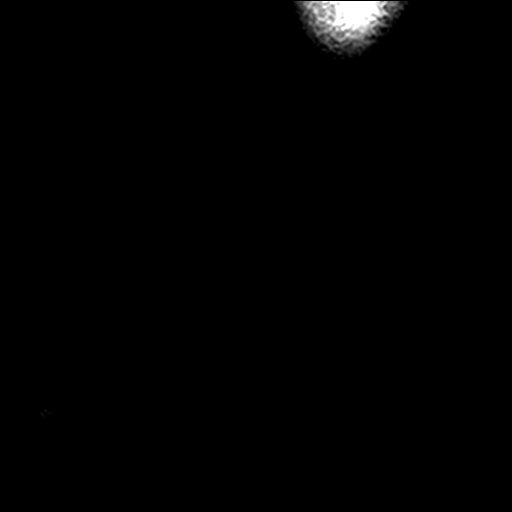

[Series 8: T2 fat-sat · coronal · 3.0mm · 0.50mm/px · 8 of 35 slices shown (2 of 2)]
[im 1/35]
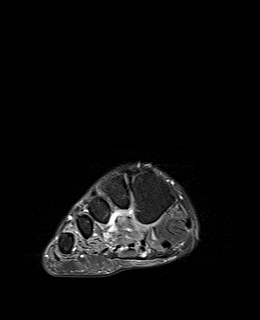
[im 4/35]
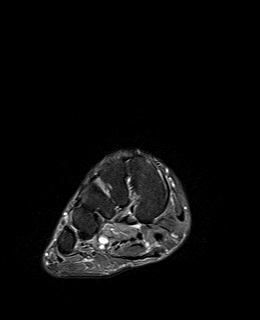
[im 12/35]
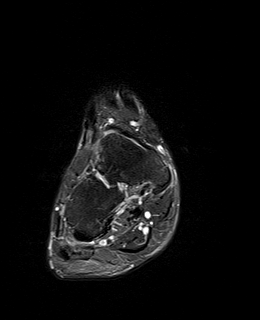
[im 16/35]
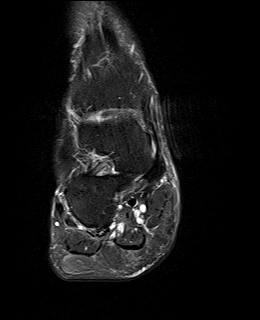
[im 19/35]
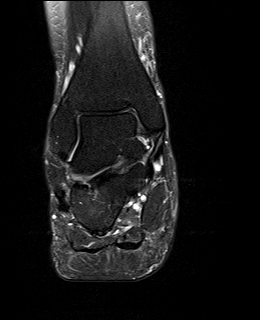
[im 23/35]
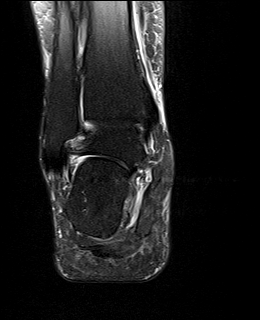
[im 31/35]
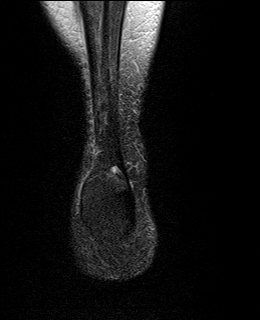
[im 35/35]
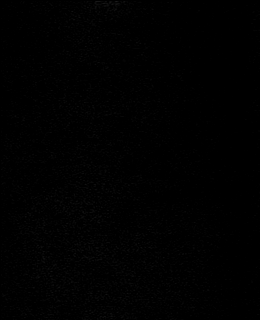

[36 of 40 positions shown; findings below may reference images not displayed]

FINDINGS: TENDONS

Peroneal: Peroneal longus tendon intact. Short segment longitudinal
split tear of the peroneal brevis tendon (series 4, image 14).

Posteromedial: Posterior tibial tendon intact. Flexor hallucis
longus tendon intact. Flexor digitorum longus tendon intact.

Anterior: Tibialis anterior tendon intact. Extensor hallucis longus
tendon intact Extensor digitorum longus tendon intact.

Achilles:  Intact.

Plantar Fascia: Intact. Prominent thickening of the proximal central
band with increased intermediate signal.

LIGAMENTS

Lateral: Anterior talofibular ligament intact. Calcaneofibular
ligament intact. Posterior talofibular ligament intact. Anterior and
posterior tibiofibular ligaments intact.

Medial: Deltoid ligament intact. Spring ligament intact.

CARTILAGE

Ankle Joint: No joint effusion. Normal ankle mortise. No chondral
defect.

Subtalar Joints/Sinus Tarsi: Normal subtalar joints. No subtalar
joint effusion. Mild loss of the normal fat within the sinus tarsi.

Bones: No marrow signal abnormality.  No fracture or dislocation.

Soft Tissue: No soft tissue mass or fluid collection.
IMPRESSION: 1. Plantar fasciitis.  No tear.
2. Short segment longitudinal split tear of the peroneal brevis
tendon.
3. Mild loss of the normal fat within the sinus tarsi. Correlate for
sinus tarsi syndrome.
